# Patient Record
Sex: Male | Born: 1945 | Race: Black or African American | Hispanic: No | Marital: Married | Smoking: Never smoker
Health system: Southern US, Community
[De-identification: ages and names within clinical notes are randomized; demographics above are authoritative.]

## PROBLEM LIST (undated history)

## (undated) DIAGNOSIS — I10 Essential (primary) hypertension: Secondary | ICD-10-CM

## (undated) DIAGNOSIS — R569 Unspecified convulsions: Secondary | ICD-10-CM

## (undated) DIAGNOSIS — N189 Chronic kidney disease, unspecified: Secondary | ICD-10-CM

## (undated) HISTORY — PX: COLONOSCOPY: SHX174

## (undated) HISTORY — PX: ESOPHAGOGASTRODUODENOSCOPY: SHX1529

---

## 2005-06-02 ENCOUNTER — Emergency Department: Payer: Self-pay | Admitting: Emergency Medicine

## 2005-07-24 ENCOUNTER — Other Ambulatory Visit: Payer: Self-pay

## 2005-07-24 ENCOUNTER — Emergency Department: Payer: Self-pay | Admitting: Unknown Physician Specialty

## 2005-07-29 ENCOUNTER — Ambulatory Visit: Payer: Self-pay | Admitting: Family Medicine

## 2005-08-03 ENCOUNTER — Ambulatory Visit: Payer: Self-pay | Admitting: Family Medicine

## 2007-01-23 ENCOUNTER — Other Ambulatory Visit: Payer: Self-pay

## 2007-01-23 ENCOUNTER — Inpatient Hospital Stay: Payer: Self-pay | Admitting: Internal Medicine

## 2007-03-15 ENCOUNTER — Other Ambulatory Visit: Payer: Self-pay

## 2007-03-15 ENCOUNTER — Emergency Department: Payer: Self-pay | Admitting: Emergency Medicine

## 2007-12-14 ENCOUNTER — Other Ambulatory Visit: Payer: Self-pay

## 2007-12-14 ENCOUNTER — Emergency Department: Payer: Self-pay | Admitting: Internal Medicine

## 2008-05-25 ENCOUNTER — Emergency Department: Payer: Self-pay | Admitting: Emergency Medicine

## 2008-06-12 ENCOUNTER — Emergency Department: Payer: Self-pay | Admitting: Emergency Medicine

## 2012-03-17 ENCOUNTER — Ambulatory Visit: Payer: Self-pay | Admitting: Ophthalmology

## 2012-03-22 ENCOUNTER — Ambulatory Visit: Payer: Self-pay | Admitting: Ophthalmology

## 2013-02-23 ENCOUNTER — Emergency Department: Payer: Self-pay | Admitting: Emergency Medicine

## 2013-02-23 LAB — URINALYSIS, COMPLETE
Bacteria: NONE SEEN
Bilirubin,UR: NEGATIVE
Ketone: NEGATIVE
Leukocyte Esterase: NEGATIVE
Ph: 7 (ref 4.5–8.0)
Protein: NEGATIVE
Specific Gravity: 1.005 (ref 1.003–1.030)

## 2013-02-23 LAB — CBC
HCT: 47.1 % (ref 40.0–52.0)
HGB: 16 g/dL (ref 13.0–18.0)
MCH: 31.3 pg (ref 26.0–34.0)
MCHC: 33.9 g/dL (ref 32.0–36.0)
MCV: 92 fL (ref 80–100)
Platelet: 224 10*3/uL (ref 150–440)
RDW: 14.8 % — ABNORMAL HIGH (ref 11.5–14.5)

## 2013-02-23 LAB — COMPREHENSIVE METABOLIC PANEL
Albumin: 3.8 g/dL (ref 3.4–5.0)
Alkaline Phosphatase: 89 U/L (ref 50–136)
Bilirubin,Total: 0.7 mg/dL (ref 0.2–1.0)
Calcium, Total: 9.1 mg/dL (ref 8.5–10.1)
Co2: 32 mmol/L (ref 21–32)
EGFR (Non-African Amer.): >60
Glucose: 95 mg/dL (ref 65–99)
Potassium: 4 mmol/L (ref 3.5–5.1)
SGOT(AST): 50 U/L — ABNORMAL HIGH (ref 15–37)
SGPT (ALT): 33 U/L (ref 12–78)
Total Protein: 7.1 g/dL (ref 6.4–8.2)

## 2013-02-23 LAB — LIPASE, BLOOD: Lipase: 134 U/L (ref 73–393)

## 2013-04-06 DIAGNOSIS — B182 Chronic viral hepatitis C: Secondary | ICD-10-CM

## 2013-04-06 HISTORY — DX: Chronic viral hepatitis C: B18.2

## 2013-05-04 ENCOUNTER — Ambulatory Visit: Payer: Self-pay | Admitting: Gastroenterology

## 2013-05-06 LAB — PATHOLOGY REPORT

## 2013-08-10 ENCOUNTER — Ambulatory Visit: Payer: Self-pay | Admitting: Gastroenterology

## 2014-02-08 ENCOUNTER — Ambulatory Visit: Payer: Self-pay | Admitting: Gastroenterology

## 2014-06-11 ENCOUNTER — Ambulatory Visit: Payer: Self-pay | Admitting: Gastroenterology

## 2014-07-24 NOTE — Op Note (Signed)
PATIENT NAME:  Ian Gibson, Ian Gibson MR#:  [id number] DATE OF BIRTH:  [birth year]  DATE OF PROCEDURE:  03/22/2012  PREOPERATIVE DIAGNOSIS: Visually significant cataract of the left eye with a retrobulbar block.  POSTOPERATIVE DIAGNOSIS: Visually significant cataract of the left eye with a retrobulbar block.  OPERATIVE PROCEDURE: Cataract extraction by phacoemulsification with implant of intraocular lens to the left eye.   SURGEON: Birder Robson, MD  ANESTHESIA:  1. Managed anesthesia care.  2. 50-50 mixture of 0.75% bupivacaine and 4% Xylocaine given as a retrobulbar block.   COMPLICATIONS: None.   TECHNIQUE:  Stop and chop.  DESCRIPTION OF PROCEDURE: The patient was examined and consented for this procedure in the preoperative holding area and then brought back to the Operating Room where the anesthesia team employed managed anesthesia care. Then 3.5 milliliters of the aforementioned mixture were placed in the left orbit on an Atkinson needle without complication. The left eye was then prepped and draped in the usual sterile ophthalmic fashion. A lid speculum was placed. The side-port blade was used to create a paracentesis and the anterior chamber was filled with viscoelastic. The keratome was used to create a near clear corneal incision. The continuous curvilinear capsulorrhexis was performed with a cystotome followed by the capsulorrhexis forceps. Hydrodissection and hydrodelineation were carried out with BSS on a blunt cannula. The lens was removed in a chop and block technique. The remaining cortical material was removed with the irrigation-aspiration handpiece. The capsular bag was inflated with viscoelastic and the Technos ZCB00 16.5-diopter lens, serial number #[id number] was placed in the capsular bag without complication. The remaining viscoelastic was removed from the eye with the irrigation-aspiration handpiece. The wounds were hydrated. The anterior chamber was flushed with Miostat. The  eye was inflated to a physiologic pressure and the wounds were found to be water tight. The eye was dressed with Vigamox followed by Maxitrol ointment and a protective shield was placed. The patient will followup with me in one day.   Also please note at the onset of the case 3 to 4 hours of clock hours of zonulysis dehiscence were noted as this is a traumatic cataract and a capsular tension ring was placed to redistribute the _____ tension and provide better centration of the implant.     ____________________________ Livingston Diones. Prisha Hiley, MD wlp:ct D: 03/22/2012 21:58:25 ET T: 03/23/2012 10:33:09 ET JOB#: [id number]  cc: Rondel Episcopo L. Nahum Sherrer, MD, <Dictator>  Livingston Diones Deroy Noah MD ELECTRONICALLY SIGNED 03/25/2012 10:52

## 2014-07-30 LAB — SURGICAL PATHOLOGY

## 2015-02-13 ENCOUNTER — Other Ambulatory Visit: Payer: Self-pay | Admitting: Gastroenterology

## 2015-02-13 DIAGNOSIS — R109 Unspecified abdominal pain: Secondary | ICD-10-CM

## 2015-02-13 DIAGNOSIS — K7460 Unspecified cirrhosis of liver: Secondary | ICD-10-CM

## 2015-02-18 ENCOUNTER — Other Ambulatory Visit: Payer: Self-pay | Admitting: Internal Medicine

## 2015-02-20 ENCOUNTER — Ambulatory Visit
Admission: RE | Admit: 2015-02-20 | Discharge: 2015-02-20 | Disposition: A | Discharge status: Home / Self Care | Payer: Medicare Other | Source: Ambulatory Visit | Attending: Gastroenterology | Admitting: Gastroenterology

## 2015-02-20 DIAGNOSIS — K7460 Unspecified cirrhosis of liver: Secondary | ICD-10-CM | POA: Insufficient documentation

## 2015-02-20 DIAGNOSIS — R109 Unspecified abdominal pain: Secondary | ICD-10-CM | POA: Insufficient documentation

## 2015-02-20 DIAGNOSIS — I2510 Atherosclerotic heart disease of native coronary artery without angina pectoris: Secondary | ICD-10-CM | POA: Insufficient documentation

## 2015-02-20 HISTORY — DX: Essential (primary) hypertension: I10

## 2015-02-20 MED ORDER — IOHEXOL 300 MG/ML  SOLN
100.0000 mL | Freq: Once | INTRAMUSCULAR | Status: AC | PRN
  Administered 2015-02-20: 100 mL via INTRAVENOUS

## 2016-03-02 ENCOUNTER — Other Ambulatory Visit: Payer: Self-pay | Admitting: Student

## 2016-03-02 DIAGNOSIS — K7460 Unspecified cirrhosis of liver: Principal | ICD-10-CM

## 2016-03-02 DIAGNOSIS — B182 Chronic viral hepatitis C: Secondary | ICD-10-CM

## 2016-03-11 ENCOUNTER — Ambulatory Visit
Admission: RE | Admit: 2016-03-11 | Discharge: 2016-03-11 | Disposition: A | Discharge status: Home / Self Care | Payer: Medicare Other | Source: Ambulatory Visit | Attending: Student | Admitting: Student

## 2016-03-11 DIAGNOSIS — B182 Chronic viral hepatitis C: Secondary | ICD-10-CM | POA: Diagnosis present

## 2016-03-11 DIAGNOSIS — K7460 Unspecified cirrhosis of liver: Secondary | ICD-10-CM | POA: Diagnosis not present

## 2016-03-11 DIAGNOSIS — K8020 Calculus of gallbladder without cholecystitis without obstruction: Secondary | ICD-10-CM | POA: Diagnosis not present

## 2017-05-18 DIAGNOSIS — Z8719 Personal history of other diseases of the digestive system: Secondary | ICD-10-CM | POA: Insufficient documentation

## 2017-05-18 DIAGNOSIS — K766 Portal hypertension: Secondary | ICD-10-CM

## 2017-05-18 DIAGNOSIS — I8500 Esophageal varices without bleeding: Secondary | ICD-10-CM | POA: Insufficient documentation

## 2017-05-20 ENCOUNTER — Other Ambulatory Visit: Payer: Self-pay | Admitting: Student

## 2017-05-20 DIAGNOSIS — K7460 Unspecified cirrhosis of liver: Principal | ICD-10-CM

## 2017-05-20 DIAGNOSIS — B182 Chronic viral hepatitis C: Secondary | ICD-10-CM

## 2017-05-25 ENCOUNTER — Ambulatory Visit
Admission: RE | Admit: 2017-05-25 | Discharge: 2017-05-25 | Disposition: A | Discharge status: Home / Self Care | Payer: Medicare Other | Source: Ambulatory Visit | Attending: Student | Admitting: Student

## 2017-05-25 DIAGNOSIS — K7689 Other specified diseases of liver: Secondary | ICD-10-CM | POA: Insufficient documentation

## 2017-05-25 DIAGNOSIS — K7460 Unspecified cirrhosis of liver: Secondary | ICD-10-CM | POA: Insufficient documentation

## 2017-05-25 DIAGNOSIS — K8020 Calculus of gallbladder without cholecystitis without obstruction: Secondary | ICD-10-CM | POA: Diagnosis not present

## 2017-05-25 DIAGNOSIS — B182 Chronic viral hepatitis C: Secondary | ICD-10-CM | POA: Insufficient documentation

## 2017-07-20 ENCOUNTER — Encounter (INDEPENDENT_AMBULATORY_CARE_PROVIDER_SITE_OTHER): Payer: Self-pay

## 2017-07-20 ENCOUNTER — Ambulatory Visit (INDEPENDENT_AMBULATORY_CARE_PROVIDER_SITE_OTHER): Payer: Medicare Other | Admitting: Gastroenterology

## 2017-07-20 ENCOUNTER — Encounter: Payer: Self-pay | Admitting: Gastroenterology

## 2017-07-20 ENCOUNTER — Other Ambulatory Visit: Payer: Self-pay

## 2017-07-20 VITALS — BP 143/76 | HR 60 | Temp 98.4°F | Ht 73.0 in | Wt 226.0 lb

## 2017-07-20 DIAGNOSIS — E118 Type 2 diabetes mellitus with unspecified complications: Secondary | ICD-10-CM | POA: Insufficient documentation

## 2017-07-20 DIAGNOSIS — E1142 Type 2 diabetes mellitus with diabetic polyneuropathy: Secondary | ICD-10-CM | POA: Insufficient documentation

## 2017-07-20 DIAGNOSIS — R1084 Generalized abdominal pain: Secondary | ICD-10-CM | POA: Diagnosis not present

## 2017-07-20 DIAGNOSIS — E119 Type 2 diabetes mellitus without complications: Secondary | ICD-10-CM | POA: Insufficient documentation

## 2017-07-20 DIAGNOSIS — K7460 Unspecified cirrhosis of liver: Secondary | ICD-10-CM

## 2017-07-20 DIAGNOSIS — B182 Chronic viral hepatitis C: Secondary | ICD-10-CM | POA: Insufficient documentation

## 2017-07-20 DIAGNOSIS — E785 Hyperlipidemia, unspecified: Secondary | ICD-10-CM | POA: Insufficient documentation

## 2017-07-20 DIAGNOSIS — Z8619 Personal history of other infectious and parasitic diseases: Secondary | ICD-10-CM | POA: Insufficient documentation

## 2017-07-20 DIAGNOSIS — I10 Essential (primary) hypertension: Secondary | ICD-10-CM | POA: Insufficient documentation

## 2017-07-20 DIAGNOSIS — G40409 Other generalized epilepsy and epileptic syndromes, not intractable, without status epilepticus: Secondary | ICD-10-CM | POA: Insufficient documentation

## 2017-07-20 MED ORDER — CYCLOBENZAPRINE HCL 5 MG PO TABS: 5.0000 mg | ORAL_TABLET | Freq: Three times a day (TID) | ORAL | 1 refills | Status: DC | PRN
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
## 2017-12-31 ENCOUNTER — Other Ambulatory Visit: Payer: Self-pay | Admitting: Student

## 2017-12-31 DIAGNOSIS — B182 Chronic viral hepatitis C: Secondary | ICD-10-CM

## 2018-02-15 ENCOUNTER — Ambulatory Visit
Admission: RE | Admit: 2018-02-15 | Discharge: 2018-02-15 | Disposition: A | Discharge status: Home / Self Care | Payer: Medicare Other | Source: Ambulatory Visit | Attending: Student | Admitting: Student

## 2018-02-15 DIAGNOSIS — K8020 Calculus of gallbladder without cholecystitis without obstruction: Secondary | ICD-10-CM | POA: Diagnosis not present

## 2018-02-15 DIAGNOSIS — R911 Solitary pulmonary nodule: Secondary | ICD-10-CM | POA: Insufficient documentation

## 2018-02-15 DIAGNOSIS — M5136 Other intervertebral disc degeneration, lumbar region: Secondary | ICD-10-CM | POA: Diagnosis not present

## 2018-02-15 DIAGNOSIS — B182 Chronic viral hepatitis C: Secondary | ICD-10-CM | POA: Diagnosis not present

## 2018-02-15 DIAGNOSIS — I700 Atherosclerosis of aorta: Secondary | ICD-10-CM | POA: Insufficient documentation

## 2018-02-15 DIAGNOSIS — K7460 Unspecified cirrhosis of liver: Secondary | ICD-10-CM | POA: Diagnosis not present

## 2018-02-15 LAB — POCT I-STAT CREATININE: CREATININE: 1.6 mg/dL — AB (ref 0.61–1.24)

## 2018-02-15 MED ORDER — GADOBUTROL 1 MMOL/ML IV SOLN
10.0000 mL | Freq: Once | INTRAVENOUS | Status: AC | PRN
  Administered 2018-02-15: 10 mL via INTRAVENOUS

## 2018-12-30 DIAGNOSIS — I129 Hypertensive chronic kidney disease with stage 1 through stage 4 chronic kidney disease, or unspecified chronic kidney disease: Secondary | ICD-10-CM | POA: Insufficient documentation

## 2018-12-30 DIAGNOSIS — N1831 Chronic kidney disease, stage 3a: Secondary | ICD-10-CM | POA: Insufficient documentation

## 2019-01-04 ENCOUNTER — Other Ambulatory Visit: Payer: Self-pay | Admitting: Nephrology

## 2019-01-04 DIAGNOSIS — N183 Chronic kidney disease, stage 3 unspecified: Secondary | ICD-10-CM

## 2019-01-13 ENCOUNTER — Other Ambulatory Visit: Payer: Self-pay | Admitting: Nephrology

## 2019-01-13 ENCOUNTER — Ambulatory Visit
Admission: RE | Admit: 2019-01-13 | Discharge: 2019-01-13 | Disposition: A | Discharge status: Home / Self Care | Payer: Medicare Other | Source: Ambulatory Visit | Attending: Nephrology | Admitting: Nephrology

## 2019-01-13 ENCOUNTER — Other Ambulatory Visit: Payer: Self-pay

## 2019-01-13 DIAGNOSIS — N183 Chronic kidney disease, stage 3 unspecified: Secondary | ICD-10-CM

## 2019-01-13 DIAGNOSIS — I129 Hypertensive chronic kidney disease with stage 1 through stage 4 chronic kidney disease, or unspecified chronic kidney disease: Secondary | ICD-10-CM

## 2020-03-06 ENCOUNTER — Encounter: Payer: Self-pay | Admitting: Internal Medicine

## 2020-04-30 ENCOUNTER — Ambulatory Visit: Payer: Medicare Other | Admitting: Gastroenterology

## 2020-05-14 ENCOUNTER — Other Ambulatory Visit: Payer: Self-pay

## 2020-05-14 ENCOUNTER — Encounter: Payer: Self-pay | Admitting: Gastroenterology

## 2020-05-14 ENCOUNTER — Ambulatory Visit (INDEPENDENT_AMBULATORY_CARE_PROVIDER_SITE_OTHER): Payer: Medicare Other | Admitting: Gastroenterology

## 2020-05-14 VITALS — BP 142/70 | HR 59 | Ht 73.0 in | Wt 246.4 lb

## 2020-05-14 DIAGNOSIS — K7460 Unspecified cirrhosis of liver: Secondary | ICD-10-CM

## 2020-05-14 DIAGNOSIS — B182 Chronic viral hepatitis C: Secondary | ICD-10-CM

## 2020-05-14 MED ORDER — NADOLOL 40 MG PO TABS: 40.0000 mg | ORAL_TABLET | Freq: Every day | ORAL | 11 refills | Status: DC
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
## 2020-05-15 ENCOUNTER — Telehealth: Payer: Self-pay | Admitting: Gastroenterology

## 2020-05-15 NOTE — Telephone Encounter (Signed)
Contacted pt's wife regarding her question about the prescription for Nadolol. They have requested a 90 day supply be sent vs a 30 day supply. Pharmacy has been contacted and switched to 90 days.

## 2020-05-15 NOTE — Telephone Encounter (Signed)
Patient's wife called with questions regarding her husband, please call to advise

## 2020-05-20 ENCOUNTER — Ambulatory Visit
Admission: RE | Admit: 2020-05-20 | Discharge: 2020-05-20 | Disposition: A | Discharge status: Home / Self Care | Payer: Medicare Other | Source: Ambulatory Visit | Attending: Gastroenterology | Admitting: Gastroenterology

## 2020-05-20 ENCOUNTER — Other Ambulatory Visit: Payer: Self-pay

## 2020-05-20 DIAGNOSIS — B182 Chronic viral hepatitis C: Secondary | ICD-10-CM | POA: Insufficient documentation

## 2020-05-20 DIAGNOSIS — K7460 Unspecified cirrhosis of liver: Secondary | ICD-10-CM | POA: Insufficient documentation

## 2020-05-21 ENCOUNTER — Telehealth: Payer: Self-pay

## 2020-05-21 NOTE — Telephone Encounter (Signed)
-----   Message from Lucilla Lame, MD sent at 05/20/2020  9:32 AM EST ----- Let the patient know that the ultrasound showed cirrhosis as expected without any sign of a mass.  There were some gallstones which do not appear to be causing any problems.  The patient needs a repeat ultrasound in 6 months.

## 2020-05-21 NOTE — Telephone Encounter (Signed)
Pt's wife notified of US results.  

## 2021-05-26 IMAGING — US US RENAL
1 series · 14 of 25 positions shown · non-contrast
Comparison: None.

CLINICAL DATA: Chronic kidney disease stage 3

EXAM:
RENAL / URINARY TRACT ULTRASOUND COMPLETE

[Series 1: us renal · 0.23mm/px · 14 of 29 slices shown]
[im 1/29]
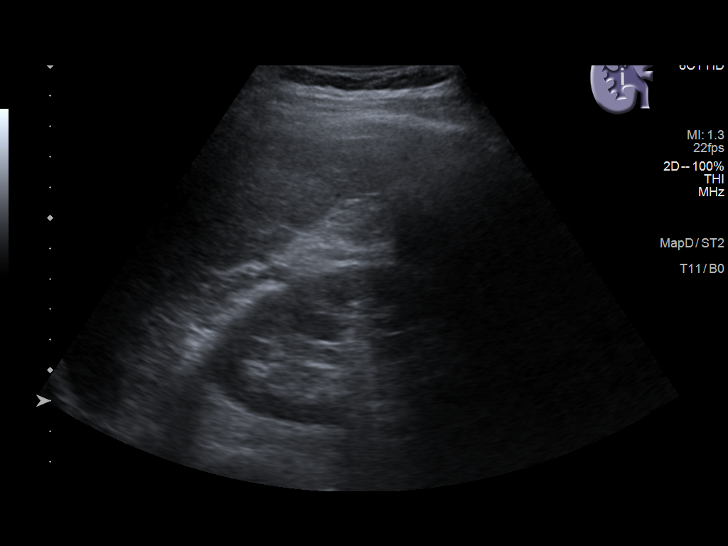
[im 3/29]
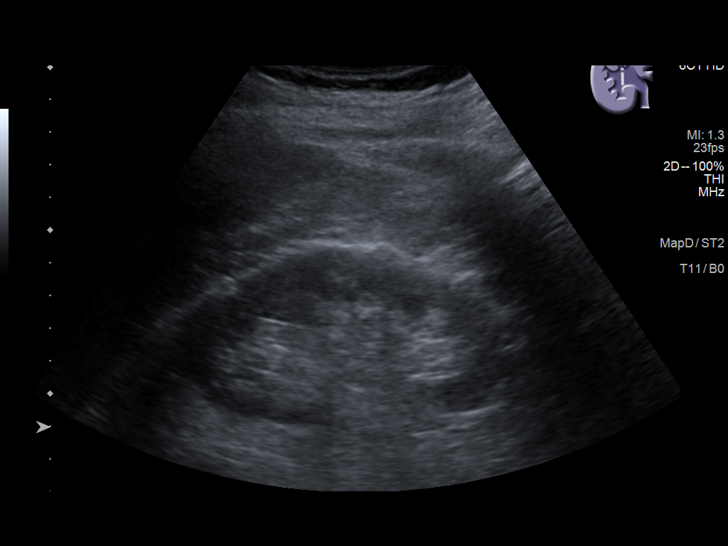
[im 5/29]
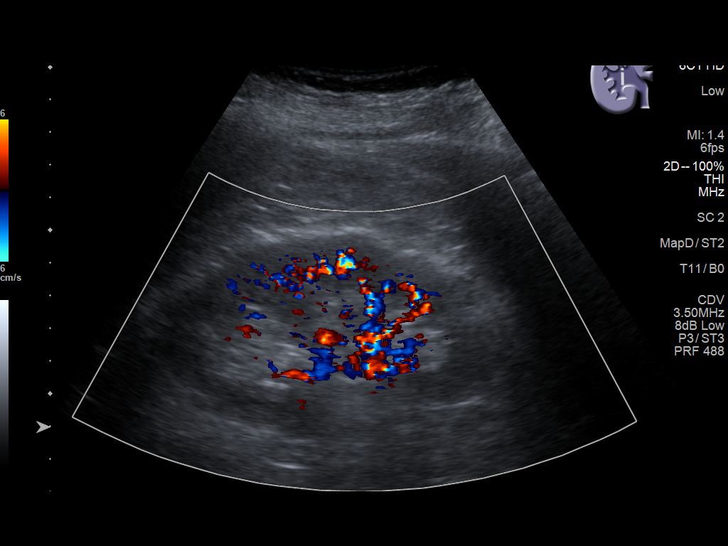
[im 8/29]
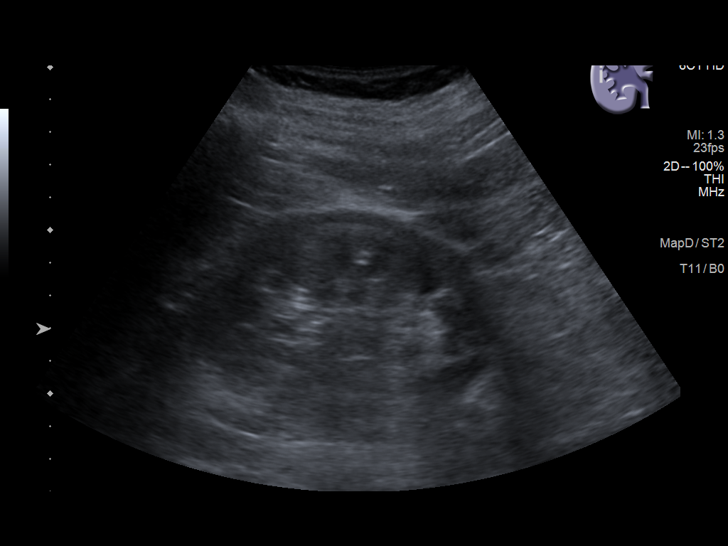
[im 10/29]
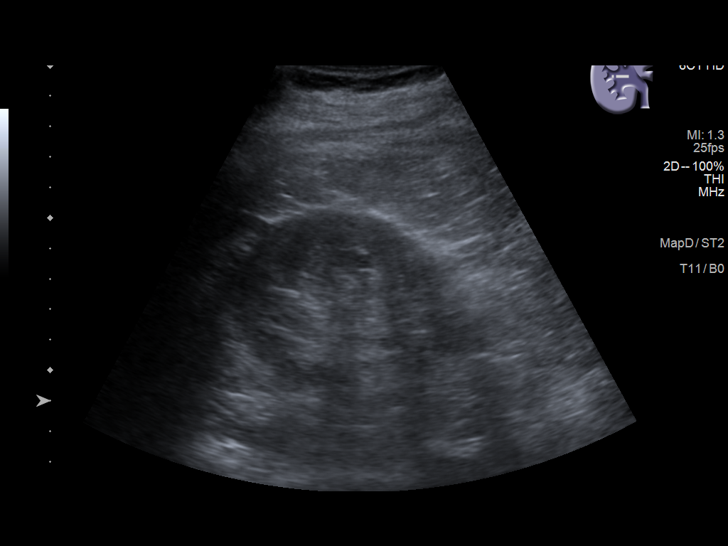
[im 11/29]
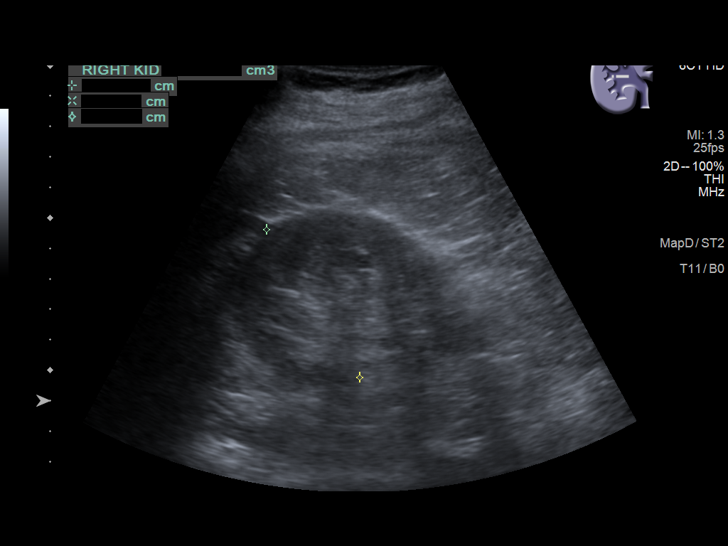
[im 13/29]
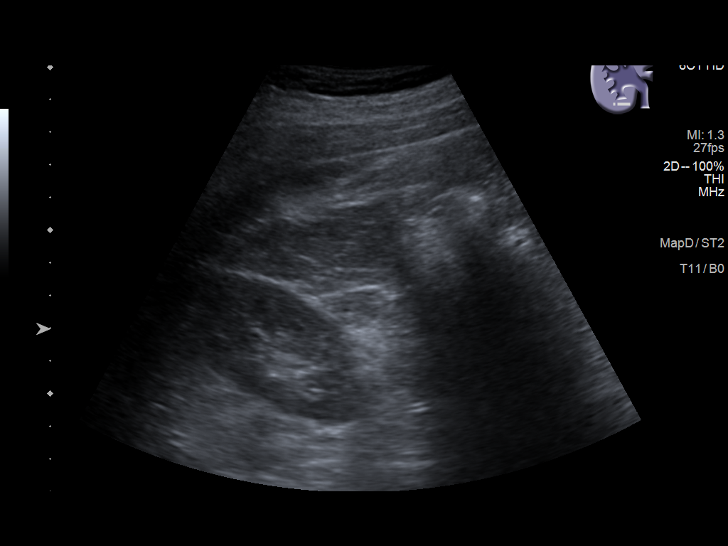
[im 16/29]
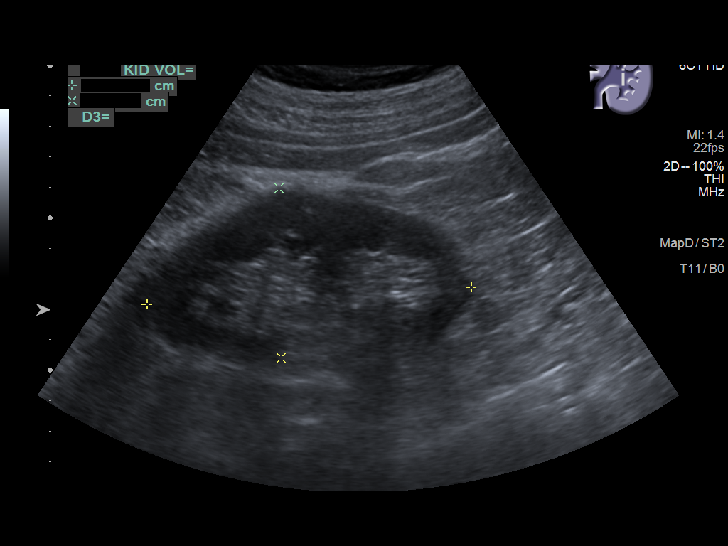
[im 18/29]
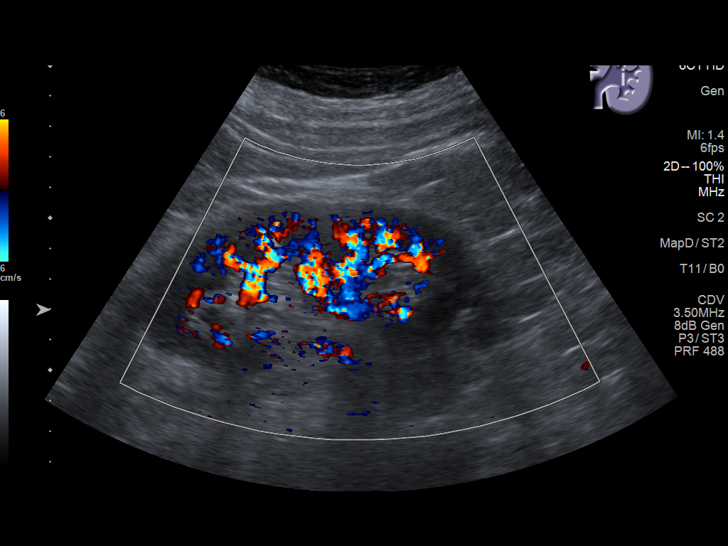
[im 19/29]
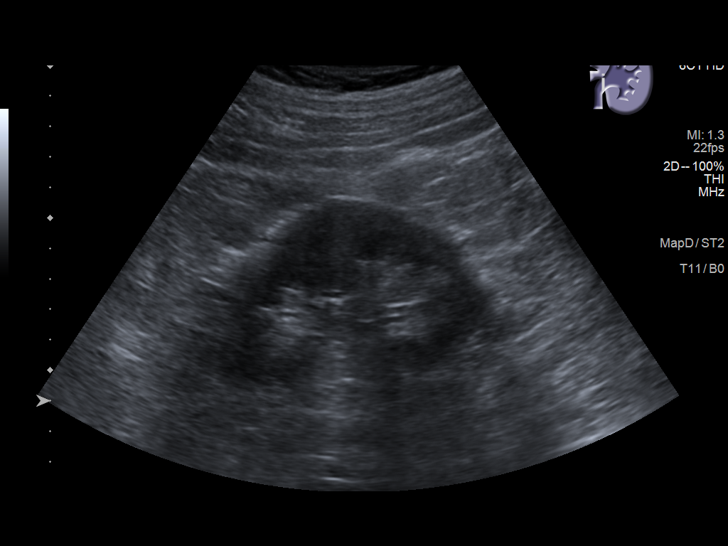
[im 22/29]
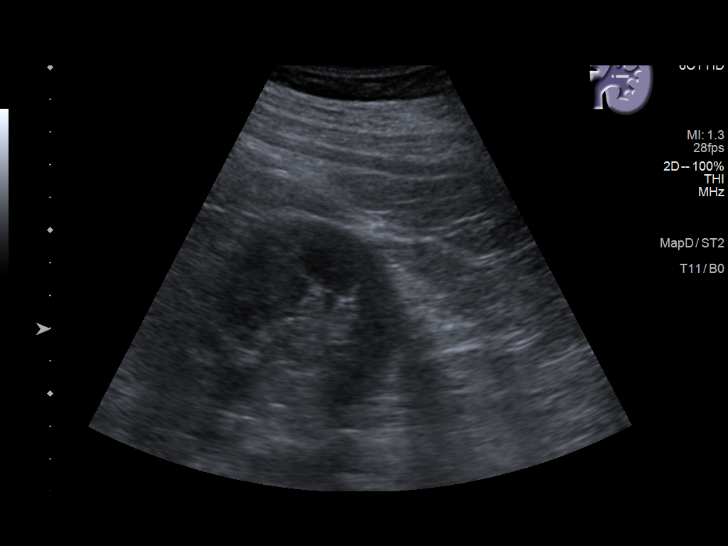
[im 24/29]
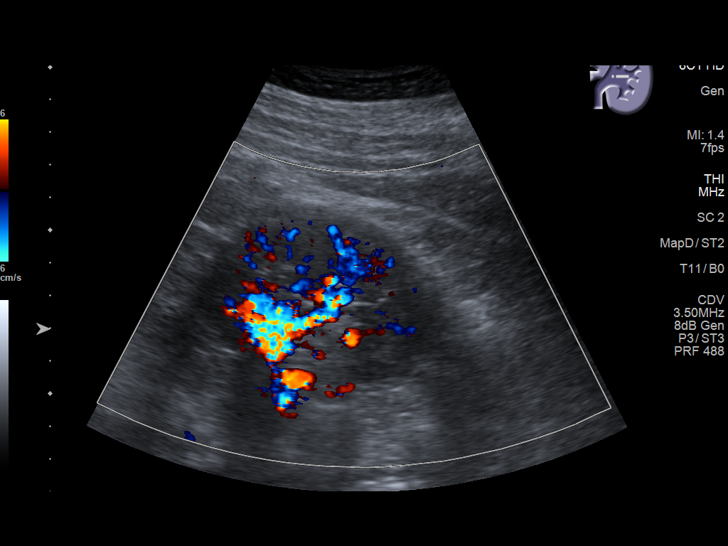
[im 26/29]
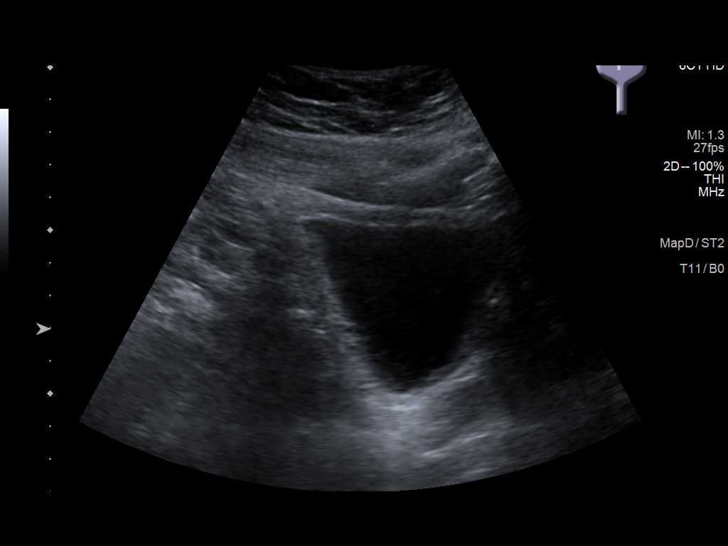
[im 29/29]
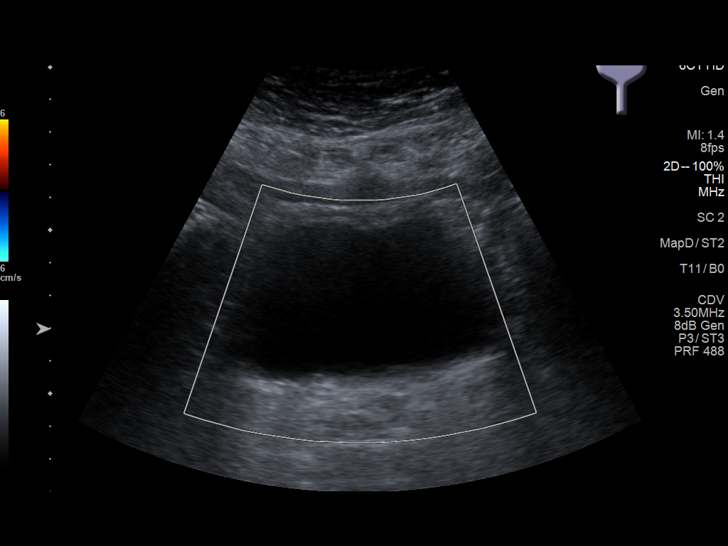

[14 of 25 positions shown; findings below may reference images not displayed]

FINDINGS: Right Kidney:

Renal measurements: 10.1 x 5.4 x 5.7 cm = volume: 163 mL .
Echogenicity within normal limits. No mass or hydronephrosis
visualized.

Left Kidney:

Renal measurements: 10.7 x 5.6 x 4.6 cm = volume: 141 mL.
Echogenicity within normal limits. No mass or hydronephrosis
visualized.

Bladder:

Appears normal for degree of bladder distention.
IMPRESSION: Normal kidney ultrasound.

## 2021-07-28 ENCOUNTER — Telehealth: Payer: Self-pay | Admitting: Gastroenterology

## 2021-07-28 NOTE — Telephone Encounter (Signed)
Patient's spouse called to make an appt for patient. States he is out of the medication nadolol 40 MG. Requesting a refill. States the pharmacy said to speak to the doctor.  ?

## 2021-07-29 MED ORDER — NADOLOL 40 MG PO TABS: 40.0000 mg | ORAL_TABLET | Freq: Every day | ORAL | 5 refills | Status: DC

## 2021-07-29 NOTE — Addendum Note (Signed)
Addended by: Lurlean Nanny on: 07/29/2021 04:10 PM ? ? Modules accepted: Orders ? ?

## 2021-09-08 ENCOUNTER — Encounter: Payer: Self-pay | Admitting: Ophthalmology

## 2021-09-15 NOTE — Discharge Instructions (Signed)

## 2021-09-16 ENCOUNTER — Other Ambulatory Visit: Payer: Self-pay

## 2021-09-16 ENCOUNTER — Ambulatory Visit: Payer: Medicare Other | Admitting: Anesthesiology

## 2021-09-16 ENCOUNTER — Encounter: Payer: Self-pay | Admitting: Ophthalmology

## 2021-09-16 ENCOUNTER — Ambulatory Visit
Admission: RE | Admit: 2021-09-16 | Discharge: 2021-09-16 | Disposition: A | Discharge status: Home / Self Care | Payer: Medicare Other | Attending: Ophthalmology | Admitting: Ophthalmology

## 2021-09-16 ENCOUNTER — Encounter
Admission: RE | Disposition: A | Discharge status: Home / Self Care | Payer: Self-pay | Source: Home / Self Care | Attending: Ophthalmology

## 2021-09-16 DIAGNOSIS — H2511 Age-related nuclear cataract, right eye: Secondary | ICD-10-CM | POA: Diagnosis present

## 2021-09-16 DIAGNOSIS — I129 Hypertensive chronic kidney disease with stage 1 through stage 4 chronic kidney disease, or unspecified chronic kidney disease: Secondary | ICD-10-CM | POA: Diagnosis not present

## 2021-09-16 DIAGNOSIS — N189 Chronic kidney disease, unspecified: Secondary | ICD-10-CM | POA: Diagnosis not present

## 2021-09-16 HISTORY — DX: Unspecified convulsions: R56.9

## 2021-09-16 HISTORY — PX: CATARACT EXTRACTION W/PHACO: SHX586

## 2021-09-16 HISTORY — DX: Chronic kidney disease, unspecified: N18.9

## 2021-09-16 SURGERY — PHACOEMULSIFICATION, CATARACT, WITH IOL INSERTION
Anesthesia: Monitor Anesthesia Care | Site: Eye | Laterality: Right

## 2021-09-16 MED ORDER — LACTATED RINGERS IV SOLN: INTRAVENOUS | Status: DC

## 2021-09-16 MED ORDER — TETRACAINE HCL 0.5 % OP SOLN
1.0000 [drp] | OPHTHALMIC | Status: DC | PRN
  Administered 2021-09-16 (×3): 1 [drp] via OPHTHALMIC

## 2021-09-16 MED ORDER — SIGHTPATH DOSE#1 BSS IO SOLN
INTRAOCULAR | Status: DC | PRN
  Administered 2021-09-16: 15 mL

## 2021-09-16 MED ORDER — SIGHTPATH DOSE#1 BSS IO SOLN: INTRAOCULAR | Status: DC | PRN

## 2021-09-16 MED ORDER — ACETAMINOPHEN 325 MG PO TABS: 325.0000 mg | ORAL_TABLET | Freq: Once | ORAL | Status: DC

## 2021-09-16 MED ORDER — SIGHTPATH DOSE#1 BSS IO SOLN
INTRAOCULAR | Status: DC | PRN
  Administered 2021-09-16: 62 mL via OPHTHALMIC

## 2021-09-16 MED ORDER — FENTANYL CITRATE (PF) 100 MCG/2ML IJ SOLN
INTRAMUSCULAR | Status: DC | PRN
Start: 2021-09-16 — End: 2021-09-16
  Administered 2021-09-16: 50 ug via INTRAVENOUS

## 2021-09-16 MED ORDER — MIDAZOLAM HCL 2 MG/2ML IJ SOLN
INTRAMUSCULAR | Status: DC | PRN
  Administered 2021-09-16: 1 mg via INTRAVENOUS

## 2021-09-16 MED ORDER — ACETAMINOPHEN 160 MG/5ML PO SOLN: 325.0000 mg | Freq: Once | ORAL | Status: DC

## 2021-09-16 MED ORDER — ARMC OPHTHALMIC DILATING DROPS
1.0000 "application " | OPHTHALMIC | Status: DC | PRN
  Administered 2021-09-16 (×3): 1 via OPHTHALMIC

## 2021-09-16 MED ORDER — SIGHTPATH DOSE#1 NA CHONDROIT SULF-NA HYALURON 40-17 MG/ML IO SOLN
INTRAOCULAR | Status: DC | PRN
  Administered 2021-09-16: 1 mL via INTRAOCULAR

## 2021-09-16 SURGICAL SUPPLY — 10 items
CATARACT SUITE SIGHTPATH (MISCELLANEOUS) ×2 IMPLANT
FEE CATARACT SUITE SIGHTPATH (MISCELLANEOUS) ×1 IMPLANT
GLOVE SURG ENC TEXT LTX SZ8 (GLOVE) ×2 IMPLANT
GLOVE SURG TRIUMPH 8.0 PF LTX (GLOVE) ×2 IMPLANT
LENS IOL TECNIS EYHANCE 16.0 (Intraocular Lens) ×1 IMPLANT
NDL FILTER BLUNT 18X1 1/2 (NEEDLE) ×1 IMPLANT
NEEDLE FILTER BLUNT 18X 1/2SAF (NEEDLE) ×1
NEEDLE FILTER BLUNT 18X1 1/2 (NEEDLE) ×1 IMPLANT
SYR 3ML LL SCALE MARK (SYRINGE) ×2 IMPLANT
WATER STERILE IRR 250ML POUR (IV SOLUTION) ×2 IMPLANT

## 2021-09-16 NOTE — H&P (Signed)
[facility]   Primary Care Physician:  Jodi Marble, MD Ophthalmologist: Dr. George Ina  Pre-Procedure History & Physical: HPI:  Ian Gibson is a 76 y.o. male here for cataract surgery.   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  "cured"   Hypertension    Seizure ([place])    on Keppra.  No seizures in "many" years.    Past Surgical History:  Procedure Laterality Date   COLONOSCOPY     ESOPHAGOGASTRODUODENOSCOPY      Prior to Admission medications   Medication Sig Start Date End Date Taking? Authorizing Provider  aspirin EC 81 MG tablet Take by mouth.   Yes [provider]  atorvastatin (LIPITOR) 40 MG tablet SMARTSIG:1 Tablet(s) By Mouth Every Evening 04/19/20  Yes [provider]  cetirizine (ZYRTEC) 10 MG tablet TK 1 T PO QAM 12/04/14  Yes [provider]  chlorthalidone (HYGROTON) 25 MG tablet TK 1/2 TO 1 T PO D UTD 04/26/17  Yes [provider]  clobetasol cream (TEMOVATE) 0.05 % APP THIN FILM AA ON BACK BID UNTIL CLEAR 07/14/17  Yes [provider]  fluticasone (FLONASE) 50 MCG/ACT nasal spray Place into both nostrils daily.   Yes [provider]  gabapentin (NEURONTIN) 800 MG tablet Take 800 mg by mouth 3 (three) times daily. 04/08/20  Yes [provider]  levETIRAcetam (KEPPRA) 500 MG tablet Take 500 mg by mouth 2 (two) times daily. 04/08/20  Yes [provider]  losartan (COZAAR) 25 MG tablet Take 25 mg by mouth daily. 05/10/20  Yes [provider]  nadolol (CORGARD) 40 MG tablet Take 1 tablet (40 mg total) by mouth daily. 07/29/21  Yes Lucilla Lame, MD  zolpidem (AMBIEN) 10 MG tablet Take 10 mg by mouth at bedtime as needed. 05/13/20  Yes [provider]    Allergies as of 08/18/2021   (No Known Allergies)    History reviewed. No pertinent family  history.  Social History   Socioeconomic History   Marital status: Married    Spouse name: Not on file   Number of children: Not on file   Years of education: Not on file   Highest education level: Not on file  Occupational History   Not on file  Tobacco Use   Smoking status: Never   Smokeless tobacco: Never  Vaping Use   Vaping Use: Never used  Substance and Sexual Activity   Alcohol use: Never   Drug use: Never   Sexual activity: Not on file  Other Topics Concern   Not on file  Social History Narrative   Not on file   Social Determinants of Health   Financial Resource Strain: Not on file  Food Insecurity: Not on file  Transportation Needs: Not on file  Physical Activity: Not on file  Stress: Not on file  Social Connections: Not on file  Intimate Partner Violence: Not on file    Review of Systems: See HPI, otherwise negative ROS  Physical Exam: BP (!) 156/79   Pulse (!) 49   Temp 97.7 F (36.5 C) (Temporal)   Ht 6' (1.829 m)   Wt 111 kg   SpO2 97%   BMI 33.20 kg/m  General:   Alert, cooperative in NAD Head:  Normocephalic and atraumatic. Respiratory:  Normal work of breathing. Cardiovascular:  RRR  Impression/Plan: Ian Manns  Gibson is here for cataract surgery.  Risks, benefits, limitations, and alternatives regarding cataract surgery have been reviewed with the patient.  Questions have been answered.  All parties agreeable.   Ian Robson, MD  09/16/2021, 1:11 PM

## 2021-09-16 NOTE — Anesthesia Procedure Notes (Signed)
Procedure Name: MAC Date/Time: 09/16/2021 1:22 PM  Performed by: Dionne Bucy, CRNAPre-anesthesia Checklist: Patient identified, Emergency Drugs available, Suction available, Patient being monitored and Timeout performed Patient Re-evaluated:Patient Re-evaluated prior to induction Oxygen Delivery Method: Nasal cannula Placement Confirmation: positive ETCO2

## 2021-09-16 NOTE — Anesthesia Postprocedure Evaluation (Signed)
Anesthesia Post Note  Patient: Ian Gibson  Procedure(s) Performed:  7.44 00:54.4 (Right: Eye)     Patient location during evaluation: PACU Anesthesia Type: MAC Level of consciousness: awake and alert and oriented Pain management: satisfactory to patient Vital Signs Assessment: post-procedure vital signs reviewed and stable Respiratory status: spontaneous breathing, nonlabored ventilation and respiratory function stable Cardiovascular status: blood pressure returned to baseline and stable Postop Assessment: Adequate PO intake and No signs of nausea or vomiting Anesthetic complications: no   No notable events documented.  Raliegh Ip

## 2021-09-16 NOTE — Transfer of Care (Signed)
Immediate Anesthesia Transfer of Care Note  Patient: Ian Gibson  Procedure(s) Performed:  7.44 00:54.4 (Right: Eye)  Patient Location: PACU  Anesthesia Type: MAC  Level of Consciousness: awake, alert  and patient cooperative  Airway and Oxygen Therapy: Patient Spontanous Breathing and Patient connected to supplemental oxygen  Post-op Assessment: Post-op Vital signs reviewed, Patient's Cardiovascular Status Stable, Respiratory Function Stable, Patent Airway and No signs of Nausea or vomiting  Post-op Vital Signs: Reviewed and stable  Complications: No notable events documented.

## 2021-09-16 NOTE — Anesthesia Preprocedure Evaluation (Signed)
Anesthesia Evaluation  Patient identified by MRN, date of birth, ID band Patient awake    Reviewed: Allergy & Precautions, H&P , NPO status , Patient's Chart, lab work & pertinent test results  Airway Mallampati: II  TM Distance: >3 FB Neck ROM: limited    Dental  (+) Edentulous Upper, Edentulous Lower   Pulmonary    Pulmonary exam normal breath sounds clear to auscultation       Cardiovascular hypertension, Normal cardiovascular exam Rhythm:regular Rate:Normal     Neuro/Psych Seizures -,     GI/Hepatic   Endo/Other    Renal/GU      Musculoskeletal   Abdominal   Peds  Hematology   Anesthesia Other Findings   Reproductive/Obstetrics                             Anesthesia Physical Anesthesia Plan  ASA: 2  Anesthesia Plan: MAC   Post-op Pain Management: Minimal or no pain anticipated   Induction:   PONV Risk Score and Plan: 1 and Treatment may vary due to age or medical condition, TIVA and Midazolam  Airway Management Planned:   Additional Equipment:   Intra-op Plan:   Post-operative Plan:   Informed Consent: I have reviewed the patients History and Physical, chart, labs and discussed the procedure including the risks, benefits and alternatives for the proposed anesthesia with the patient or authorized representative who has indicated his/her understanding and acceptance.     Dental Advisory Given  Plan Discussed with: CRNA  Anesthesia Plan Comments:         Anesthesia Quick Evaluation

## 2021-09-16 NOTE — Op Note (Signed)
PREOPERATIVE DIAGNOSIS:  Nuclear sclerotic cataract of the right eye.   POSTOPERATIVE DIAGNOSIS:  H25.11- CATARACT   OPERATIVE PROCEDURE:ORPROCALL@   SURGEON:  Birder Robson, MD.   ANESTHESIA:  Anesthesiologist: Ronelle Nigh, MD CRNA: Dionne Bucy, CRNA  1.      Managed anesthesia care. 2.      0.14m of Shugarcaine was instilled in the eye following the paracentesis.   COMPLICATIONS:  None.   TECHNIQUE:   Stop and chop   DESCRIPTION OF PROCEDURE:  The patient was examined and consented in the preoperative holding area where the aforementioned topical anesthesia was applied to the right eye and then brought back to the Operating Room where the right eye was prepped and draped in the usual sterile ophthalmic fashion and a lid speculum was placed. A paracentesis was created with the side port blade and the anterior chamber was filled with viscoelastic. A near clear corneal incision was performed with the steel keratome. A continuous curvilinear capsulorrhexis was performed with a cystotome followed by the capsulorrhexis forceps. Hydrodissection and hydrodelineation were carried out with BSS on a blunt cannula. The lens was removed in a stop and chop  technique and the remaining cortical material was removed with the irrigation-aspiration handpiece. The capsular bag was inflated with viscoelastic and the Technis ZCB00  lens was placed in the capsular bag without complication. The remaining viscoelastic was removed from the eye with the irrigation-aspiration handpiece. The wounds were hydrated. The anterior chamber was flushed with BSS and the eye was inflated to physiologic pressure. 0.166mof Vigamox was placed in the anterior chamber. The wounds were found to be water tight. The eye was dressed with Combigan. The patient was given protective glasses to wear throughout the day and a shield with which to sleep tonight. The patient was also given drops with which to begin a drop regimen today  and will follow-up with me in one day. Implant Name Type Inv. Item Serial No. Manufacturer Lot No. LRB No. Used Action  LENS IOL TECNIS EYHANCE 16.0 - [id number] Lens LENS IOL TECNIS EYHANCE 16.0 [id number]  Right 1 Implanted   Procedure(s):  7.44 00:54.4 (Right)  Electronically signed: WiBirder Robson/13/2023 1:38 PM

## 2021-09-17 ENCOUNTER — Encounter: Payer: Self-pay | Admitting: Ophthalmology

## 2021-12-02 ENCOUNTER — Ambulatory Visit: Payer: Medicare Other | Admitting: Gastroenterology

## 2021-12-22 ENCOUNTER — Ambulatory Visit (INDEPENDENT_AMBULATORY_CARE_PROVIDER_SITE_OTHER): Payer: Medicare Other | Admitting: Urology

## 2021-12-22 ENCOUNTER — Encounter: Payer: Self-pay | Admitting: Urology

## 2021-12-22 VITALS — BP 175/92 | HR 66 | Ht 73.0 in | Wt 240.0 lb

## 2021-12-22 DIAGNOSIS — R972 Elevated prostate specific antigen [PSA]: Secondary | ICD-10-CM | POA: Diagnosis not present

## 2021-12-22 LAB — URINALYSIS, COMPLETE
Bilirubin, UA: NEGATIVE
Glucose, UA: NEGATIVE
Ketones, UA: NEGATIVE
Leukocytes,UA: NEGATIVE
Nitrite, UA: NEGATIVE
Protein,UA: NEGATIVE
RBC, UA: NEGATIVE
Specific Gravity, UA: 1.01 (ref 1.005–1.030)
Urobilinogen, Ur: 0.2 mg/dL (ref 0.2–1.0)
pH, UA: 7 (ref 5.0–7.5)

## 2021-12-22 LAB — MICROSCOPIC EXAMINATION: Bacteria, UA: NONE SEEN

## 2021-12-22 NOTE — Progress Notes (Signed)
12/22/2021 9:49 AM   Ian Gibson 1945-10-20 [id number]  Referring provider: Jodi Marble, MD [place],  [place] [address]  Chief Complaint  Patient presents with   Elevated PSA    HPI: Ian Gibson is a 76 y.o. male referred for evaluation of an elevated PSA.  He presents with his spouse  PSA 11/06/2021 was 16.1 (13% free PSA).  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  "cured"   Hypertension    Seizure ([place])    on Keppra.  No seizures in "many" years.    Surgical History: Past Surgical History:  Procedure Laterality Date   CATARACT EXTRACTION W/PHACO Right 09/16/2021   Procedure:  7.44 00:54.4;  Surgeon: Birder Robson, MD;  Location: [facility];  Service: Ophthalmology;  Laterality: Right;   COLONOSCOPY     ESOPHAGOGASTRODUODENOSCOPY      Home Medications:  Allergies as of 12/22/2021   No Known Allergies      Medication List        Accurate as of December 22, 2021  9:49 AM. If you have any questions, ask your nurse or doctor.          aspirin EC 81 MG tablet Take by mouth.   atorvastatin 40 MG tablet Commonly known as: LIPITOR SMARTSIG:1 Tablet(s) By Mouth Every Evening   cetirizine 10 MG tablet Commonly known as: ZYRTEC TK 1 T PO QAM   chlorthalidone 25 MG tablet Commonly known as: HYGROTON TK 1/2 TO 1 T PO D UTD   clobetasol cream 0.05 % Commonly known as: TEMOVATE APP THIN FILM AA ON BACK BID UNTIL CLEAR   fluticasone 50 MCG/ACT nasal spray Commonly known as: FLONASE Place into both nostrils  daily.   gabapentin 800 MG tablet Commonly known as: NEURONTIN Take 800 mg by mouth 3 (three) times daily.   levETIRAcetam 500 MG tablet Commonly known as: KEPPRA Take 500 mg by mouth 2 (two) times daily.   lisinopril 20 MG tablet Commonly known as: ZESTRIL Take 20 mg by mouth daily.   losartan 25 MG tablet Commonly known as: COZAAR Take 25 mg by mouth daily.   nadolol 40 MG tablet Commonly known as: CORGARD Take 1 tablet (40 mg total) by mouth daily.   zolpidem 10 MG tablet Commonly known as: AMBIEN Take 10 mg by mouth at bedtime as needed.        Allergies: No Known Allergies  Family History: No family history on file.  Social History:  reports that he has never smoked. He has never used smokeless tobacco. He reports that he does not drink alcohol and does not use drugs.   Physical Exam: BP (!) 175/92   Pulse 66   Ht '6\' 1"'$  (1.854 m)   Wt 240 lb (108.9 kg)   BMI 31.66 kg/m   Constitutional:  Alert and oriented, No acute distress. HEENT: [place] AT Respiratory: Normal respiratory effort, no increased work of breathing. GU: Prostate 40 g, smooth without nodules Skin: No rashes, bruises or suspicious lesions. Neurologic: Grossly intact, no focal deficits, moving  all 4 extremities. Psychiatric: Normal mood and affect.   Assessment & Plan:    1.  Elevated PSA Although PSA is a prostate cancer screening test he was informed that cancer is not the most common cause of an elevated PSA. Other potential causes including BPH and inflammation were discussed. He was informed that the only way to adequately diagnose prostate cancer would be a transrectal ultrasound and biopsy of the prostate. The procedure was discussed including potential risks of bleeding and infection/sepsis. He was also informed that a negative biopsy does not conclusively rule out the possibility that prostate cancer may be present and that continued monitoring is required. The use of newer adjunctive  blood tests including 4kScore was discussed. The use of multiparametric prostate MRI to evaluate for abnormality suspicious for high-grade prostate cancer and aid in targeted biopsy was reviewed. Continued periodic surveillance was also discussed but not recommended for a PSA of this level. [facility] was contacted and last PSA results over the past 5 years was requested.  His only other PSA was 09/21/2021 which was 18.1.  The patient will be contacted with this information.  Recommend prostate biopsy though MRI could be performed to evaluate for high-grade lesions   Abbie Sons, MD  [place] [address] [place], [place] [address] [phone]

## 2021-12-23 ENCOUNTER — Encounter: Payer: Self-pay | Admitting: Gastroenterology

## 2021-12-23 ENCOUNTER — Ambulatory Visit (INDEPENDENT_AMBULATORY_CARE_PROVIDER_SITE_OTHER): Payer: Medicare Other | Admitting: Gastroenterology

## 2021-12-23 VITALS — BP 207/108 | HR 49 | Temp 98.4°F | Wt 244.0 lb

## 2021-12-23 DIAGNOSIS — K7460 Unspecified cirrhosis of liver: Secondary | ICD-10-CM

## 2021-12-23 DIAGNOSIS — B182 Chronic viral hepatitis C: Secondary | ICD-10-CM

## 2021-12-23 MED ORDER — NADOLOL 40 MG PO TABS: 40.0000 mg | ORAL_TABLET | Freq: Every day | ORAL | 1 refills | Status: DC

## 2021-12-23 NOTE — Patient Instructions (Addendum)
If you need to cancel or reschedule the ultrasound, please call [phone]  You will need to go to [facility] entrance on Monday, September 25th, arrive at 8:30am.   You can not have anything to eat or drink after 12 midnight the day before.  Please follow up with PCP regarding elevated BP readings

## 2021-12-23 NOTE — Progress Notes (Signed)
Primary Care Physician: Jodi Marble, MD  Primary Gastroenterologist:  Dr. Lucilla Lame  Chief Complaint  Patient presents with   Cirrhosis   Follow-up   Medication Refill    Nadolol    HPI: Ian Gibson is a 76 y.o. male here for follow-up after being diagnosed with hepatitis C cirrhosis and had treatment by Dr. At the [facility] many years ago.  The patient has been doing well without any problems.  The patient has not followed up for his [facility] surveillance.  He is not reporting any abdominal pain nausea vomiting fevers or chills.  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  "cured"   Hypertension    Seizure ([place])    on Keppra.  No seizures in "many" years.    Current Outpatient Medications  Medication Sig Dispense Refill   aspirin EC 81 MG tablet Take by mouth.     atorvastatin (LIPITOR) 40 MG tablet SMARTSIG:1 Tablet(s) By Mouth Every Evening     cetirizine (ZYRTEC) 10 MG tablet TK 1 T PO QAM     chlorthalidone (HYGROTON) 25 MG tablet TK 1/2 TO 1 T PO D UTD  0   clobetasol cream (TEMOVATE) 0.05 % APP THIN FILM AA ON BACK BID UNTIL CLEAR  2   fluticasone (FLONASE) 50 MCG/ACT nasal spray Place into both nostrils daily.     gabapentin (NEURONTIN) 800 MG tablet Take 800 mg by mouth 3 (three) times daily.     levETIRAcetam (KEPPRA) 500 MG tablet Take 500 mg by mouth 2 (two) times daily.     lisinopril (ZESTRIL) 20 MG tablet Take 20 mg by mouth daily.     losartan (COZAAR) 25 MG tablet Take 25 mg by mouth daily.     nadolol (CORGARD) 40 MG tablet Take 1 tablet (40 mg total) by mouth daily. 30 tablet 5   zolpidem (AMBIEN) 10 MG tablet Take 10 mg by mouth at bedtime as needed.     No current facility-administered medications for this visit.    Allergies as of 12/23/2021   (No Known Allergies)    ROS:  General: Negative for anorexia, weight  loss, fever, chills, fatigue, weakness. ENT: Negative for hoarseness, difficulty swallowing , nasal congestion. CV: Negative for chest pain, angina, palpitations, dyspnea on exertion, peripheral edema.  Respiratory: Negative for dyspnea at rest, dyspnea on exertion, cough, sputum, wheezing.  GI: See history of present illness. GU:  Negative for dysuria, hematuria, urinary incontinence, urinary frequency, nocturnal urination.  Endo: Negative for unusual weight change.    Physical Examination:   BP (!) 201/87   Pulse (!) 49   Temp 98.4 F (36.9 C) (Oral)   Wt 244 lb (110.7 kg)   BMI 32.19 kg/m   General: Well-nourished, well-developed in no acute distress.  Eyes: No icterus. Conjunctivae pink. Extremities: No lower extremity edema. No clubbing or deformities. Neuro: Alert and oriented x 3.  Grossly intact. Skin: Warm and dry, no jaundice.   Psych: Alert and cooperative, normal mood and affect.  Labs:    Imaging Studies: No results found.  Assessment and Plan:   Ian Gibson is a 76 y.o. y/o male who comes in with a history of cirrhosis.  The patient needs a refill of his nadolol.  He has no issues at the present time.  The patient has been  told that he needs a ultrasound and alpha-fetoprotein every 6 months for [facility] surveillance.  The patient will be set up for this at the present time.  The patient has been explained the plan and agrees with it.     Lucilla Lame, MD. [facility]    Note: This dictation was prepared with Dragon dictation along with smaller phrase technology. Any transcriptional errors that result from this process are unintentional.

## 2021-12-24 LAB — AFP TUMOR MARKER: AFP, Serum, Tumor Marker: 3.6 ng/mL (ref 0.0–8.4)

## 2021-12-29 ENCOUNTER — Ambulatory Visit
Admission: RE | Admit: 2021-12-29 | Discharge: 2021-12-29 | Disposition: A | Discharge status: Home / Self Care | Payer: Medicare Other | Source: Ambulatory Visit | Attending: Gastroenterology | Admitting: Gastroenterology

## 2021-12-29 DIAGNOSIS — B182 Chronic viral hepatitis C: Secondary | ICD-10-CM | POA: Diagnosis not present

## 2021-12-29 DIAGNOSIS — K7460 Unspecified cirrhosis of liver: Secondary | ICD-10-CM | POA: Insufficient documentation

## 2022-01-20 ENCOUNTER — Telehealth: Payer: Self-pay | Admitting: Urology

## 2022-01-20 NOTE — Telephone Encounter (Signed)
Pt's wife called office, said she s/w Lattie Haw on 10/9 asking the next steps they should take.  They didn't know if he needed a biopsy or more imaging.  I saw the message in his chart, but no one has contacted them.

## 2022-01-21 NOTE — Telephone Encounter (Signed)
No previous PSA results seen from records sent over from [facility].  Recommend scheduling prostate biopsy

## 2022-01-22 NOTE — Telephone Encounter (Signed)
Spoke with wife and ordered prostate biopsy, also mailed prostate biopsy instructions to home address.

## 2022-02-05 ENCOUNTER — Ambulatory Visit (INDEPENDENT_AMBULATORY_CARE_PROVIDER_SITE_OTHER): Payer: Medicare Other | Admitting: Urology

## 2022-02-05 ENCOUNTER — Encounter: Payer: Self-pay | Admitting: Urology

## 2022-02-05 VITALS — Ht 72.0 in | Wt 244.0 lb

## 2022-02-05 DIAGNOSIS — R972 Elevated prostate specific antigen [PSA]: Secondary | ICD-10-CM

## 2022-02-05 DIAGNOSIS — C61 Malignant neoplasm of prostate: Secondary | ICD-10-CM | POA: Diagnosis not present

## 2022-02-05 MED ORDER — LEVOFLOXACIN 500 MG PO TABS
500.0000 mg | ORAL_TABLET | Freq: Once | ORAL | Status: AC
  Administered 2022-02-05: 500 mg via ORAL

## 2022-02-05 MED ORDER — GENTAMICIN SULFATE 40 MG/ML IJ SOLN
80.0000 mg | Freq: Once | INTRAMUSCULAR | Status: AC
  Administered 2022-02-05: 80 mg via INTRAMUSCULAR

## 2022-02-05 NOTE — Progress Notes (Signed)
   Prostate Biopsy Procedure   Informed consent was obtained after discussing risks/benefits of the procedure.  A time out was performed to ensure correct patient identity.  Pre-Procedure: - Last PSA Level: 16.1 (13% free) 11/06/2021 - Gentamicin given prophylactically - Levaquin 500 mg administered PO -Transrectal Ultrasound performed revealing a 54 gm prostate -No significant hypoechoic or median lobe noted  Procedure: - Prostate block performed using 10 cc 1% lidocaine and biopsies taken from sextant areas, a total of 12 under ultrasound guidance.  Post-Procedure: - Patient tolerated the procedure well - He was counseled to seek immediate medical attention if experiences any severe pain, significant bleeding, or fevers - Return in one week to discuss biopsy results; will call patient when received   John Giovanni, MD

## 2022-02-05 NOTE — Addendum Note (Signed)
Addended by: Chrystie Nose on: 02/05/2022 01:04 PM   Modules accepted: Orders

## 2022-02-06 LAB — SURGICAL PATHOLOGY

## 2022-02-12 ENCOUNTER — Encounter: Payer: Self-pay | Admitting: Urology

## 2022-02-12 ENCOUNTER — Ambulatory Visit (INDEPENDENT_AMBULATORY_CARE_PROVIDER_SITE_OTHER): Payer: Medicare Other | Admitting: Urology

## 2022-02-12 VITALS — BP 165/89 | HR 64 | Ht 72.0 in | Wt 244.0 lb

## 2022-02-12 DIAGNOSIS — C61 Malignant neoplasm of prostate: Secondary | ICD-10-CM

## 2022-02-15 ENCOUNTER — Encounter: Payer: Self-pay | Admitting: Urology

## 2022-02-15 NOTE — Progress Notes (Signed)
02/12/2022 12:11 PM   Ian Gibson [birth year] [id number]  Referring provider: Jodi Marble, MD [place],  [place] [id number]  Chief Complaint  Patient presents with   Results    HPI: 76 y.o. male presents for prostate biopsy follow-up.  He had no postbiopsy complaints.  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  "cured"   Hypertension    Seizure ([place])    on Keppra.  No seizures in "many" years.    Surgical History: Past Surgical History:  Procedure Laterality Date   CATARACT EXTRACTION W/PHACO Right 09/16/2021   Procedure:  7.44 00:54.4;  Surgeon: Birder Robson, MD;  Location: [facility];  Service: Ophthalmology;  Laterality: Right;   COLONOSCOPY     ESOPHAGOGASTRODUODENOSCOPY      Home Medications:  Allergies as of 02/12/2022   No Known Allergies      Medication List        Accurate as of February 12, 2022 11:59 PM. If you have any questions, ask your nurse or doctor.          STOP taking these medications    chlorthalidone 25 MG tablet Commonly known as: HYGROTON Stopped by: Abbie Sons, MD   fluticasone 50 MCG/ACT nasal spray Commonly known as: FLONASE Stopped by: Abbie Sons, MD   losartan 25 MG tablet Commonly known as: COZAAR Stopped by: Abbie Sons, MD       TAKE these medications    aspirin EC 81 MG tablet Take by mouth.   atorvastatin 40 MG tablet Commonly known as: LIPITOR SMARTSIG:1 Tablet(s) By Mouth Every Evening   cetirizine 10 MG tablet Commonly known as: ZYRTEC TK 1 T PO QAM   clobetasol cream 0.05 % Commonly known as: TEMOVATE APP THIN FILM AA ON BACK BID UNTIL CLEAR   gabapentin 800 MG tablet Commonly known as: NEURONTIN Take 800 mg  by mouth 3 (three) times daily.   levETIRAcetam 500 MG tablet Commonly known as: KEPPRA Take 500 mg by mouth 2 (two) times daily.   lisinopril 40 MG tablet Commonly known as: ZESTRIL Take 40 mg by mouth every morning. What changed: Another medication with the same name was removed. Continue taking this medication, and follow the directions you see here. Changed by: Abbie Sons, MD   nadolol 40 MG tablet Commonly known as: CORGARD Take 1 tablet (40 mg total) by mouth daily.   zolpidem 10 MG tablet Commonly known as: AMBIEN Take 10 mg by mouth at bedtime as needed.        Allergies: No Known Allergies  Family History: No family history on file.  Social History:  reports that he has never smoked. He has never used smokeless tobacco. He reports that he does not drink alcohol and does not use drugs.   Physical Exam: BP (!) 165/89   Pulse 64   Ht 6' (1.829 m)   Wt 244 lb (110.7 kg)   BMI 33.09 kg/m   Constitutional:  Alert and oriented, No acute distress. Psychiatric: Normal mood and affect.   Assessment & Plan:    1.  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

## 2022-02-25 ENCOUNTER — Institutional Professional Consult (permissible substitution): Payer: Medicare Other | Admitting: Radiation Oncology

## 2022-03-10 ENCOUNTER — Ambulatory Visit
Admission: RE | Admit: 2022-03-10 | Discharge: 2022-03-10 | Disposition: A | Discharge status: Home / Self Care | Payer: Medicare Other | Source: Ambulatory Visit | Attending: Radiation Oncology | Admitting: Radiation Oncology

## 2022-03-10 ENCOUNTER — Encounter: Payer: Self-pay | Admitting: Radiation Oncology

## 2022-03-10 VITALS — Temp 95.9°F | Ht 73.0 in | Wt 245.0 lb

## 2022-03-10 DIAGNOSIS — Z510 Encounter for antineoplastic radiation therapy: Secondary | ICD-10-CM | POA: Diagnosis not present

## 2022-03-10 DIAGNOSIS — C61 Malignant neoplasm of prostate: Secondary | ICD-10-CM | POA: Diagnosis present

## 2022-03-10 NOTE — Consult Note (Signed)
NEW PATIENT EVALUATION  Name: Ian Gibson  MRN: [id number]  Date:   03/10/2022     DOB: 07-15-1945   This 77 y.o. 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  16 range  REFERRING PHYSICIAN: Jodi Marble, MD  CHIEF COMPLAINT:  Chief Complaint  Patient presents with   Prostate Cancer    Consult    DIAGNOSIS: The encounter diagnosis was Malignant neoplasm of prostate ([place]).   PREVIOUS INVESTIGATIONS:  Pathology report reviewed Clinical notes reviewed Bone scan ordered  HPI: Patient is a 76 year old male who presented with an elevated PSA of 16.1.  There were no prior PSA results.  He has some minor lower urinary tract symptoms no significant nocturia distant urea.  He was seen by Dr. Bernardo Gibson and underwent transrectal ultrasound-guided biopsy showing 2 of 12 cores positive for Gleason 7 (3+4) adenocarcinoma.  He has not had a bone scan at this point.  Treatment options were discussed with the patient and he is interested in radiation oncology evaluation.  PLANNED TREATMENT REGIMEN: Image guided IMRT radiation therapy with single dose of Eligard  PAST MEDICAL HISTORY:  has a past medical history of CKD (chronic kidney disease), Hepatic cirrhosis due to chronic hepatitis C infection ([place]) (2015), Hypertension, and Seizure ([place]).    PAST SURGICAL HISTORY:  Past Surgical History:  Procedure Laterality Date   CATARACT EXTRACTION W/PHACO Right 09/16/2021   Procedure:  7.44 00:54.4;  Surgeon: Ian Robson, MD;  Location: [facility];  Service: Ophthalmology;  Laterality: Right;   COLONOSCOPY     ESOPHAGOGASTRODUODENOSCOPY      FAMILY HISTORY: family history is not on file.  SOCIAL HISTORY:  reports that he has never smoked. He has never used smokeless tobacco. He reports that he does not drink alcohol and does not use drugs.  ALLERGIES: Patient has  no known allergies.  MEDICATIONS:  Current Outpatient Medications  Medication Sig Dispense Refill   aspirin EC 81 MG tablet Take by mouth.     atorvastatin (LIPITOR) 40 MG tablet SMARTSIG:1 Tablet(s) By Mouth Every Evening     cetirizine (ZYRTEC) 10 MG tablet TK 1 T PO QAM     clobetasol cream (TEMOVATE) 0.05 % APP THIN FILM AA ON BACK BID UNTIL CLEAR  2   gabapentin (NEURONTIN) 800 MG tablet Take 800 mg by mouth 3 (three) times daily.     levETIRAcetam (KEPPRA) 500 MG tablet Take 500 mg by mouth 2 (two) times daily.     lisinopril (ZESTRIL) 40 MG tablet Take 40 mg by mouth every morning.     nadolol (CORGARD) 40 MG tablet Take 1 tablet (40 mg total) by mouth daily. 90 tablet 1   zolpidem (AMBIEN) 10 MG tablet Take 10 mg by mouth at bedtime as needed.     No current facility-administered medications for this encounter.    ECOG PERFORMANCE STATUS:  0 - Asymptomatic  REVIEW OF SYSTEMS: Patient denies any weight loss, fatigue, weakness, fever, chills or night sweats. Patient denies any loss of vision, blurred vision. Patient denies any ringing  of the ears or hearing loss. No irregular heartbeat. Patient denies heart murmur or history of fainting. Patient denies any chest pain or pain radiating to her upper extremities. Patient denies any shortness of breath, difficulty breathing at night, cough or hemoptysis. Patient denies any swelling in the lower legs. Patient denies any nausea  vomiting, vomiting of blood, or coffee ground material in the vomitus. Patient denies any stomach pain. Patient states has had normal bowel movements no significant constipation or diarrhea. Patient denies any dysuria, hematuria or significant nocturia. Patient denies any problems walking, swelling in the joints or loss of balance. Patient denies any skin changes, loss of hair or loss of weight. Patient denies any excessive worrying or anxiety or significant depression. Patient denies any problems with insomnia. Patient  denies excessive thirst, polyuria, polydipsia. Patient denies any swollen glands, patient denies easy bruising or easy bleeding. Patient denies any recent infections, allergies or URI. Patient "s visual fields have not changed significantly in recent time.   PHYSICAL EXAM: Temp (!) 95.9 F (35.5 C)   Ht '6\' 1"'$  (1.854 m)   Wt 245 lb (111.1 kg)   BMI 32.32 kg/m  Well-developed well-nourished patient in NAD. HEENT reveals PERLA, EOMI, discs not visualized.  Oral cavity is clear. No oral mucosal lesions are identified. Neck is clear without evidence of cervical or supraclavicular adenopathy. Lungs are clear to A&P. Cardiac examination is essentially unremarkable with regular rate and rhythm without murmur rub or thrill. Abdomen is benign with no organomegaly or masses noted. Motor sensory and DTR levels are equal and symmetric in the upper and lower extremities. Cranial nerves II through XII are grossly intact. Proprioception is intact. No peripheral adenopathy or edema is identified. No motor or sensory levels are noted. Crude visual fields are within normal range.  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  Patient is interested in external beam IMRT treatment.  Risks and benefits of treatment occluding increased lower Neri tract symptoms diarrhea fatigue alteration of blood counts all were discussed in detail with the patient.  I asked Dr. Bernardo Gibson to place fiducial markers for daily image guided treatment I have also asked Dr. Bernardo Gibson to give the patient one 26-monthdepot of Eligard.  We will set up simulation after his markers are placed.  Patient and wife both comprehend my treatment plan  well.  I would like to take this opportunity to thank you for allowing me to participate in the care of your patient..Ian Filbert MD

## 2022-03-11 ENCOUNTER — Telehealth: Payer: Self-pay

## 2022-03-11 NOTE — Telephone Encounter (Signed)
-----   Message from Despina Hidden, [place] sent at 03/10/2022 11:26 AM EST -----  ----- Message ----- From: Christean Grief, RN Sent: 03/10/2022   9:46 AM EST To: Manus Rudd, RN; [facility]  Good Morning,   This patient will need Eligard and Markers placed.  He and his wife would like early morning appointments and no Wednesdays.    Thanks,  Ranelle Oyster,

## 2022-03-11 NOTE — Telephone Encounter (Signed)
Called pt scheduled him for marker placement and Eligard injection. No PA required for Eligard. Pt informed to complete enema prior to appt and advised to bring markers with him.

## 2022-03-24 ENCOUNTER — Ambulatory Visit
Admission: RE | Admit: 2022-03-24 | Discharge: 2022-03-24 | Disposition: A | Discharge status: Home / Self Care | Payer: Medicare Other | Source: Ambulatory Visit | Attending: Radiation Oncology | Admitting: Radiation Oncology

## 2022-03-24 ENCOUNTER — Encounter
Admission: RE | Admit: 2022-03-24 | Discharge: 2022-03-24 | Disposition: A | Discharge status: Home / Self Care | Payer: Medicare Other | Source: Ambulatory Visit | Attending: Radiation Oncology | Admitting: Radiation Oncology

## 2022-03-24 DIAGNOSIS — C61 Malignant neoplasm of prostate: Secondary | ICD-10-CM | POA: Insufficient documentation

## 2022-03-24 MED ORDER — TECHNETIUM TC 99M MEDRONATE IV KIT
20.0000 | PACK | Freq: Once | INTRAVENOUS | Status: AC | PRN
  Administered 2022-03-24: 21.77 via INTRAVENOUS

## 2022-03-26 ENCOUNTER — Encounter: Payer: Self-pay | Admitting: Urology

## 2022-03-26 ENCOUNTER — Ambulatory Visit (INDEPENDENT_AMBULATORY_CARE_PROVIDER_SITE_OTHER): Payer: Medicare Other | Admitting: Urology

## 2022-03-26 VITALS — BP 182/73 | HR 76 | Ht 73.0 in | Wt 244.0 lb

## 2022-03-26 DIAGNOSIS — C61 Malignant neoplasm of prostate: Secondary | ICD-10-CM

## 2022-03-26 MED ORDER — LEUPROLIDE ACETATE (6 MONTH) 45 MG ~~LOC~~ KIT
45.0000 mg | PACK | Freq: Once | SUBCUTANEOUS | Status: AC
  Administered 2022-03-26: 45 mg via SUBCUTANEOUS

## 2022-03-26 MED ORDER — LEVOFLOXACIN 500 MG PO TABS
500.0000 mg | ORAL_TABLET | Freq: Once | ORAL | Status: AC
  Administered 2022-03-26: 500 mg via ORAL

## 2022-03-26 MED ORDER — GENTAMICIN SULFATE 40 MG/ML IJ SOLN
80.0000 mg | Freq: Once | INTRAMUSCULAR | Status: AC
  Administered 2022-03-26: 80 mg via INTRAMUSCULAR

## 2022-03-26 NOTE — Patient Instructions (Signed)
continue on Vitamin D 800-1000iu and Calcium 1000-'1200mg'$  daily while on Androgen Deprivation Therapy.

## 2022-03-26 NOTE — Progress Notes (Signed)
03/26/22  CC: gold fiducial marker placement  HPI: 76 y.o. male with prostate cancer who presents today for placement of fiducial seed markers in anticipation of his upcoming IMRT with Dr. Baruch Gouty.  Prostate Gold fiducial Marker Placement Procedure   Informed consent was obtained after discussing risks/benefits of the procedure.  A time out was performed to ensure correct patient identity.  Pre-Procedure: - Gentamicin given prophylactically - PO Levaquin 500 mg also given today  Procedure: - Lidocaine jelly was administered per rectum - Rectal ultrasound probe was placed without difficulty and the prostate visualized - Prostatic block performed with 10 mL 1% Xylocaine - 3 fiducial gold seed markers placed, one at right base, one at left base, one at apex of prostate gland under transrectal ultrasound guidance  Post-Procedure: - Patient tolerated the procedure well - He was counseled to seek immediate medical attention if experiences any severe pain, significant bleeding, or fevers -He also received a Depo Lupron injection.  One 64-monthinjection was recommended by radiation oncology.  Side effects were discussed    SJohn Giovanni MD

## 2022-03-26 NOTE — Progress Notes (Signed)
Eligard SubQ Injection   Due to Prostate Cancer patient is present today for a Eligard Injection.  Medication: Eligard 6 month Dose: 45 mg  Location: left  Lot: [id number] Exp: 04/07/2023  Patient tolerated well, no complications were noted  Performed by: Elberta Leatherwood, [place]  Per Dr. Baruch Gouty patient is to continue therapy for 6 months. This appointment was scheduled using wheel and given to patient today along with reminder continue on Vitamin D 800-1000iu and Calcium 1000-'1200mg'$  daily while on Androgen Deprivation Therapy.  PA approval dates:

## 2022-03-27 ENCOUNTER — Encounter: Payer: Self-pay | Admitting: Urology

## 2022-03-31 ENCOUNTER — Ambulatory Visit
Admission: RE | Admit: 2022-03-31 | Discharge: 2022-03-31 | Disposition: A | Discharge status: Home / Self Care | Payer: Medicare Other | Source: Ambulatory Visit | Attending: Radiation Oncology | Admitting: Radiation Oncology

## 2022-03-31 DIAGNOSIS — Z510 Encounter for antineoplastic radiation therapy: Secondary | ICD-10-CM | POA: Diagnosis not present

## 2022-04-01 ENCOUNTER — Ambulatory Visit: Payer: Medicare Other

## 2022-04-01 DIAGNOSIS — Z510 Encounter for antineoplastic radiation therapy: Secondary | ICD-10-CM | POA: Diagnosis not present

## 2022-04-02 ENCOUNTER — Ambulatory Visit: Payer: Medicare Other

## 2022-04-10 ENCOUNTER — Other Ambulatory Visit: Payer: Self-pay | Admitting: *Deleted

## 2022-04-10 DIAGNOSIS — C61 Malignant neoplasm of prostate: Secondary | ICD-10-CM

## 2022-04-13 ENCOUNTER — Ambulatory Visit
Admission: RE | Admit: 2022-04-13 | Discharge: 2022-04-13 | Disposition: A | Discharge status: Home / Self Care | Payer: Medicare Other | Source: Ambulatory Visit | Attending: Radiation Oncology | Admitting: Radiation Oncology

## 2022-04-13 DIAGNOSIS — C61 Malignant neoplasm of prostate: Secondary | ICD-10-CM | POA: Insufficient documentation

## 2022-04-13 DIAGNOSIS — Z510 Encounter for antineoplastic radiation therapy: Secondary | ICD-10-CM | POA: Insufficient documentation

## 2022-04-14 ENCOUNTER — Ambulatory Visit
Admission: RE | Admit: 2022-04-14 | Discharge: 2022-04-14 | Disposition: A | Discharge status: Home / Self Care | Payer: Medicare Other | Source: Ambulatory Visit | Attending: Radiation Oncology | Admitting: Radiation Oncology

## 2022-04-14 ENCOUNTER — Other Ambulatory Visit: Payer: Self-pay

## 2022-04-14 DIAGNOSIS — Z510 Encounter for antineoplastic radiation therapy: Secondary | ICD-10-CM | POA: Diagnosis not present

## 2022-04-14 DIAGNOSIS — C61 Malignant neoplasm of prostate: Secondary | ICD-10-CM | POA: Diagnosis present

## 2022-04-14 LAB — RAD ONC ARIA SESSION SUMMARY
Course Elapsed Days: 0
Plan Fractions Treated to Date: 1
Plan Prescribed Dose Per Fraction: 2 Gy
Plan Total Fractions Prescribed: 40
Plan Total Prescribed Dose: 80 Gy
Reference Point Dosage Given to Date: 2 Gy
Reference Point Session Dosage Given: 2 Gy
Session Number: 1

## 2022-04-15 ENCOUNTER — Ambulatory Visit
Admission: RE | Admit: 2022-04-15 | Discharge: 2022-04-15 | Disposition: A | Discharge status: Home / Self Care | Payer: Medicare Other | Source: Ambulatory Visit | Attending: Radiation Oncology | Admitting: Radiation Oncology

## 2022-04-15 ENCOUNTER — Other Ambulatory Visit: Payer: Self-pay

## 2022-04-15 DIAGNOSIS — Z510 Encounter for antineoplastic radiation therapy: Secondary | ICD-10-CM | POA: Diagnosis not present

## 2022-04-15 LAB — RAD ONC ARIA SESSION SUMMARY
Course Elapsed Days: 1
Plan Fractions Treated to Date: 2
Plan Prescribed Dose Per Fraction: 2 Gy
Plan Total Fractions Prescribed: 40
Plan Total Prescribed Dose: 80 Gy
Reference Point Dosage Given to Date: 4 Gy
Reference Point Session Dosage Given: 2 Gy
Session Number: 2

## 2022-04-16 ENCOUNTER — Ambulatory Visit: Payer: Medicare Other

## 2022-04-17 ENCOUNTER — Ambulatory Visit: Payer: Medicare Other

## 2022-04-20 ENCOUNTER — Ambulatory Visit
Admission: RE | Admit: 2022-04-20 | Discharge: 2022-04-20 | Disposition: A | Discharge status: Home / Self Care | Payer: Medicare Other | Source: Ambulatory Visit | Attending: Radiation Oncology | Admitting: Radiation Oncology

## 2022-04-20 ENCOUNTER — Other Ambulatory Visit: Payer: Self-pay

## 2022-04-20 ENCOUNTER — Other Ambulatory Visit: Payer: Self-pay | Admitting: Gastroenterology

## 2022-04-20 DIAGNOSIS — Z510 Encounter for antineoplastic radiation therapy: Secondary | ICD-10-CM | POA: Diagnosis not present

## 2022-04-20 LAB — RAD ONC ARIA SESSION SUMMARY
Course Elapsed Days: 6
Plan Fractions Treated to Date: 3
Plan Prescribed Dose Per Fraction: 2 Gy
Plan Total Fractions Prescribed: 40
Plan Total Prescribed Dose: 80 Gy
Reference Point Dosage Given to Date: 6 Gy
Reference Point Session Dosage Given: 2 Gy
Session Number: 3

## 2022-04-21 ENCOUNTER — Ambulatory Visit
Admission: RE | Admit: 2022-04-21 | Discharge: 2022-04-21 | Disposition: A | Discharge status: Home / Self Care | Payer: Medicare Other | Source: Ambulatory Visit | Attending: Radiation Oncology | Admitting: Radiation Oncology

## 2022-04-21 ENCOUNTER — Other Ambulatory Visit: Payer: Self-pay

## 2022-04-21 DIAGNOSIS — Z510 Encounter for antineoplastic radiation therapy: Secondary | ICD-10-CM | POA: Diagnosis not present

## 2022-04-21 LAB — RAD ONC ARIA SESSION SUMMARY
Course Elapsed Days: 7
Plan Fractions Treated to Date: 4
Plan Prescribed Dose Per Fraction: 2 Gy
Plan Total Fractions Prescribed: 40
Plan Total Prescribed Dose: 80 Gy
Reference Point Dosage Given to Date: 8 Gy
Reference Point Session Dosage Given: 2 Gy
Session Number: 4

## 2022-04-22 ENCOUNTER — Ambulatory Visit
Admission: RE | Admit: 2022-04-22 | Discharge: 2022-04-22 | Disposition: A | Discharge status: Home / Self Care | Payer: Medicare Other | Source: Ambulatory Visit | Attending: Radiation Oncology | Admitting: Radiation Oncology

## 2022-04-22 ENCOUNTER — Other Ambulatory Visit: Payer: Self-pay

## 2022-04-22 DIAGNOSIS — Z510 Encounter for antineoplastic radiation therapy: Secondary | ICD-10-CM | POA: Diagnosis not present

## 2022-04-22 LAB — RAD ONC ARIA SESSION SUMMARY
Course Elapsed Days: 8
Plan Fractions Treated to Date: 5
Plan Prescribed Dose Per Fraction: 2 Gy
Plan Total Fractions Prescribed: 40
Plan Total Prescribed Dose: 80 Gy
Reference Point Dosage Given to Date: 10 Gy
Reference Point Session Dosage Given: 2 Gy
Session Number: 5

## 2022-04-23 ENCOUNTER — Other Ambulatory Visit: Payer: Self-pay

## 2022-04-23 ENCOUNTER — Ambulatory Visit
Admission: RE | Admit: 2022-04-23 | Discharge: 2022-04-23 | Disposition: A | Discharge status: Home / Self Care | Payer: Medicare Other | Source: Ambulatory Visit | Attending: Radiation Oncology | Admitting: Radiation Oncology

## 2022-04-23 DIAGNOSIS — Z510 Encounter for antineoplastic radiation therapy: Secondary | ICD-10-CM | POA: Diagnosis not present

## 2022-04-23 LAB — RAD ONC ARIA SESSION SUMMARY
Course Elapsed Days: 9
Plan Fractions Treated to Date: 6
Plan Prescribed Dose Per Fraction: 2 Gy
Plan Total Fractions Prescribed: 40
Plan Total Prescribed Dose: 80 Gy
Reference Point Dosage Given to Date: 12 Gy
Reference Point Session Dosage Given: 2 Gy
Session Number: 6

## 2022-04-24 ENCOUNTER — Ambulatory Visit
Admission: RE | Admit: 2022-04-24 | Discharge: 2022-04-24 | Disposition: A | Discharge status: Home / Self Care | Payer: Medicare Other | Source: Ambulatory Visit | Attending: Radiation Oncology | Admitting: Radiation Oncology

## 2022-04-24 ENCOUNTER — Other Ambulatory Visit: Payer: Self-pay

## 2022-04-24 DIAGNOSIS — Z510 Encounter for antineoplastic radiation therapy: Secondary | ICD-10-CM | POA: Diagnosis not present

## 2022-04-24 LAB — RAD ONC ARIA SESSION SUMMARY
Course Elapsed Days: 10
Plan Fractions Treated to Date: 7
Plan Prescribed Dose Per Fraction: 2 Gy
Plan Total Fractions Prescribed: 40
Plan Total Prescribed Dose: 80 Gy
Reference Point Dosage Given to Date: 14 Gy
Reference Point Session Dosage Given: 2 Gy
Session Number: 7

## 2022-04-27 ENCOUNTER — Other Ambulatory Visit: Payer: Self-pay

## 2022-04-27 ENCOUNTER — Ambulatory Visit
Admission: RE | Admit: 2022-04-27 | Discharge: 2022-04-27 | Disposition: A | Discharge status: Home / Self Care | Payer: Medicare Other | Source: Ambulatory Visit | Attending: Radiation Oncology | Admitting: Radiation Oncology

## 2022-04-27 DIAGNOSIS — Z510 Encounter for antineoplastic radiation therapy: Secondary | ICD-10-CM | POA: Diagnosis not present

## 2022-04-27 LAB — RAD ONC ARIA SESSION SUMMARY
Course Elapsed Days: 13
Plan Fractions Treated to Date: 8
Plan Prescribed Dose Per Fraction: 2 Gy
Plan Total Fractions Prescribed: 40
Plan Total Prescribed Dose: 80 Gy
Reference Point Dosage Given to Date: 16 Gy
Reference Point Session Dosage Given: 2 Gy
Session Number: 8

## 2022-04-28 ENCOUNTER — Other Ambulatory Visit: Payer: Self-pay

## 2022-04-28 ENCOUNTER — Ambulatory Visit
Admission: RE | Admit: 2022-04-28 | Discharge: 2022-04-28 | Disposition: A | Discharge status: Home / Self Care | Payer: Medicare Other | Source: Ambulatory Visit | Attending: Radiation Oncology | Admitting: Radiation Oncology

## 2022-04-28 DIAGNOSIS — Z510 Encounter for antineoplastic radiation therapy: Secondary | ICD-10-CM | POA: Diagnosis not present

## 2022-04-28 LAB — RAD ONC ARIA SESSION SUMMARY
Course Elapsed Days: 14
Plan Fractions Treated to Date: 9
Plan Prescribed Dose Per Fraction: 2 Gy
Plan Total Fractions Prescribed: 40
Plan Total Prescribed Dose: 80 Gy
Reference Point Dosage Given to Date: 18 Gy
Reference Point Session Dosage Given: 2 Gy
Session Number: 9

## 2022-04-29 ENCOUNTER — Inpatient Hospital Stay: Payer: Medicare Other

## 2022-04-29 ENCOUNTER — Ambulatory Visit
Admission: RE | Admit: 2022-04-29 | Discharge: 2022-04-29 | Disposition: A | Discharge status: Home / Self Care | Payer: Medicare Other | Source: Ambulatory Visit | Attending: Radiation Oncology | Admitting: Radiation Oncology

## 2022-04-29 ENCOUNTER — Other Ambulatory Visit: Payer: Self-pay

## 2022-04-29 DIAGNOSIS — Z510 Encounter for antineoplastic radiation therapy: Secondary | ICD-10-CM | POA: Diagnosis not present

## 2022-04-29 DIAGNOSIS — C61 Malignant neoplasm of prostate: Secondary | ICD-10-CM | POA: Insufficient documentation

## 2022-04-29 LAB — CBC
HCT: 45.5 % (ref 39.0–52.0)
Hemoglobin: 14.8 g/dL (ref 13.0–17.0)
MCH: 31 pg (ref 26.0–34.0)
MCHC: 32.5 g/dL (ref 30.0–36.0)
MCV: 95.4 fL (ref 80.0–100.0)
Platelets: 255 10*3/uL (ref 150–400)
RBC: 4.77 MIL/uL (ref 4.22–5.81)
RDW: 13.2 % (ref 11.5–15.5)
WBC: 6.4 10*3/uL (ref 4.0–10.5)
nRBC: 0 % (ref 0.0–0.2)

## 2022-04-29 LAB — RAD ONC ARIA SESSION SUMMARY
Course Elapsed Days: 15
Plan Fractions Treated to Date: 10
Plan Prescribed Dose Per Fraction: 2 Gy
Plan Total Fractions Prescribed: 40
Plan Total Prescribed Dose: 80 Gy
Reference Point Dosage Given to Date: 20 Gy
Reference Point Session Dosage Given: 2 Gy
Session Number: 10

## 2022-04-30 ENCOUNTER — Ambulatory Visit
Admission: RE | Admit: 2022-04-30 | Discharge: 2022-04-30 | Disposition: A | Discharge status: Home / Self Care | Payer: Medicare Other | Source: Ambulatory Visit | Attending: Radiation Oncology | Admitting: Radiation Oncology

## 2022-04-30 ENCOUNTER — Other Ambulatory Visit: Payer: Self-pay

## 2022-04-30 DIAGNOSIS — Z510 Encounter for antineoplastic radiation therapy: Secondary | ICD-10-CM | POA: Diagnosis not present

## 2022-04-30 LAB — RAD ONC ARIA SESSION SUMMARY
Course Elapsed Days: 16
Plan Fractions Treated to Date: 11
Plan Prescribed Dose Per Fraction: 2 Gy
Plan Total Fractions Prescribed: 40
Plan Total Prescribed Dose: 80 Gy
Reference Point Dosage Given to Date: 22 Gy
Reference Point Session Dosage Given: 2 Gy
Session Number: 11

## 2022-05-01 ENCOUNTER — Other Ambulatory Visit: Payer: Self-pay

## 2022-05-01 ENCOUNTER — Ambulatory Visit
Admission: RE | Admit: 2022-05-01 | Discharge: 2022-05-01 | Disposition: A | Discharge status: Home / Self Care | Payer: Medicare Other | Source: Ambulatory Visit | Attending: Radiation Oncology | Admitting: Radiation Oncology

## 2022-05-01 DIAGNOSIS — Z510 Encounter for antineoplastic radiation therapy: Secondary | ICD-10-CM | POA: Diagnosis not present

## 2022-05-01 LAB — RAD ONC ARIA SESSION SUMMARY
Course Elapsed Days: 17
Plan Fractions Treated to Date: 12
Plan Prescribed Dose Per Fraction: 2 Gy
Plan Total Fractions Prescribed: 40
Plan Total Prescribed Dose: 80 Gy
Reference Point Dosage Given to Date: 24 Gy
Reference Point Session Dosage Given: 2 Gy
Session Number: 12

## 2022-05-04 ENCOUNTER — Other Ambulatory Visit: Payer: Self-pay

## 2022-05-04 ENCOUNTER — Ambulatory Visit: Admission: RE | Admit: 2022-05-04 | Payer: Medicare Other | Source: Ambulatory Visit

## 2022-05-04 ENCOUNTER — Ambulatory Visit
Admission: RE | Admit: 2022-05-04 | Discharge: 2022-05-04 | Disposition: A | Discharge status: Home / Self Care | Payer: Medicare Other | Source: Ambulatory Visit | Attending: Radiation Oncology | Admitting: Radiation Oncology

## 2022-05-04 ENCOUNTER — Ambulatory Visit: Payer: Medicare Other

## 2022-05-04 DIAGNOSIS — Z510 Encounter for antineoplastic radiation therapy: Secondary | ICD-10-CM | POA: Diagnosis not present

## 2022-05-04 LAB — RAD ONC ARIA SESSION SUMMARY
Course Elapsed Days: 20
Plan Fractions Treated to Date: 13
Plan Prescribed Dose Per Fraction: 2 Gy
Plan Total Fractions Prescribed: 40
Plan Total Prescribed Dose: 80 Gy
Reference Point Dosage Given to Date: 26 Gy
Reference Point Session Dosage Given: 2 Gy
Session Number: 13

## 2022-05-05 ENCOUNTER — Ambulatory Visit
Admission: RE | Admit: 2022-05-05 | Discharge: 2022-05-05 | Disposition: A | Discharge status: Home / Self Care | Payer: Medicare Other | Source: Ambulatory Visit | Attending: Radiation Oncology | Admitting: Radiation Oncology

## 2022-05-05 ENCOUNTER — Other Ambulatory Visit: Payer: Self-pay

## 2022-05-05 DIAGNOSIS — Z510 Encounter for antineoplastic radiation therapy: Secondary | ICD-10-CM | POA: Diagnosis not present

## 2022-05-05 LAB — RAD ONC ARIA SESSION SUMMARY
Course Elapsed Days: 21
Plan Fractions Treated to Date: 14
Plan Prescribed Dose Per Fraction: 2 Gy
Plan Total Fractions Prescribed: 40
Plan Total Prescribed Dose: 80 Gy
Reference Point Dosage Given to Date: 28 Gy
Reference Point Session Dosage Given: 2 Gy
Session Number: 14

## 2022-05-06 ENCOUNTER — Other Ambulatory Visit: Payer: Self-pay | Admitting: Internal Medicine

## 2022-05-06 ENCOUNTER — Other Ambulatory Visit: Payer: Self-pay

## 2022-05-06 ENCOUNTER — Ambulatory Visit
Admission: RE | Admit: 2022-05-06 | Discharge: 2022-05-06 | Disposition: A | Discharge status: Home / Self Care | Payer: Medicare Other | Source: Ambulatory Visit | Attending: Radiation Oncology | Admitting: Radiation Oncology

## 2022-05-06 DIAGNOSIS — Z510 Encounter for antineoplastic radiation therapy: Secondary | ICD-10-CM | POA: Diagnosis not present

## 2022-05-06 LAB — RAD ONC ARIA SESSION SUMMARY
Course Elapsed Days: 22
Plan Fractions Treated to Date: 15
Plan Prescribed Dose Per Fraction: 2 Gy
Plan Total Fractions Prescribed: 40
Plan Total Prescribed Dose: 80 Gy
Reference Point Dosage Given to Date: 30 Gy
Reference Point Session Dosage Given: 2 Gy
Session Number: 15

## 2022-05-07 ENCOUNTER — Ambulatory Visit
Admission: RE | Admit: 2022-05-07 | Discharge: 2022-05-07 | Disposition: A | Discharge status: Home / Self Care | Payer: Medicare Other | Source: Ambulatory Visit | Attending: Radiation Oncology | Admitting: Radiation Oncology

## 2022-05-07 ENCOUNTER — Other Ambulatory Visit: Payer: Self-pay

## 2022-05-07 DIAGNOSIS — Z510 Encounter for antineoplastic radiation therapy: Secondary | ICD-10-CM | POA: Diagnosis not present

## 2022-05-07 DIAGNOSIS — C61 Malignant neoplasm of prostate: Secondary | ICD-10-CM | POA: Diagnosis present

## 2022-05-07 LAB — LIPID PANEL W/O CHOL/HDL RATIO
Cholesterol, Total: 156 mg/dL (ref 100–199)
HDL: 50 mg/dL (ref >39)
LDL Chol Calc (NIH): 74 mg/dL (ref 0–99)
Triglycerides: 195 mg/dL — ABNORMAL HIGH (ref 0–149)
VLDL Cholesterol Cal: 32 mg/dL (ref 5–40)

## 2022-05-07 LAB — HGB A1C W/O EAG: Hgb A1c MFr Bld: 6.1 % — ABNORMAL HIGH (ref 4.8–5.6)

## 2022-05-07 LAB — COMPREHENSIVE METABOLIC PANEL
ALT: 57 IU/L — ABNORMAL HIGH (ref 0–44)
AST: 56 IU/L — ABNORMAL HIGH (ref 0–40)
Albumin/Globulin Ratio: 2 (ref 1.2–2.2)
Albumin: 4.7 g/dL (ref 3.8–4.8)
Alkaline Phosphatase: 76 IU/L (ref 44–121)
BUN/Creatinine Ratio: 10 (ref 10–24)
BUN: 15 mg/dL (ref 8–27)
Bilirubin Total: 0.5 mg/dL (ref 0.0–1.2)
CO2: 26 mmol/L (ref 20–29)
Calcium: 9.8 mg/dL (ref 8.6–10.2)
Chloride: 109 mmol/L — ABNORMAL HIGH (ref 96–106)
Creatinine, Ser: 1.43 mg/dL — ABNORMAL HIGH (ref 0.76–1.27)
Globulin, Total: 2.4 g/dL (ref 1.5–4.5)
Glucose: 87 mg/dL (ref 70–99)
Potassium: 4.5 mmol/L (ref 3.5–5.2)
Sodium: 136 mmol/L (ref 134–144)
Total Protein: 7.1 g/dL (ref 6.0–8.5)
eGFR: 51 mL/min/{1.73_m2} — ABNORMAL LOW (ref >59)

## 2022-05-07 LAB — RAD ONC ARIA SESSION SUMMARY
Course Elapsed Days: 23
Plan Fractions Treated to Date: 16
Plan Prescribed Dose Per Fraction: 2 Gy
Plan Total Fractions Prescribed: 40
Plan Total Prescribed Dose: 80 Gy
Reference Point Dosage Given to Date: 32 Gy
Reference Point Session Dosage Given: 2 Gy
Session Number: 16

## 2022-05-07 NOTE — Progress Notes (Signed)
Order(s) created erroneously. Erroneous order ID: [id number]  Order canceled by: Milas Hock  Order cancel date/time: 05/07/2022 3:42 PM

## 2022-05-08 ENCOUNTER — Encounter: Payer: Self-pay | Admitting: Internal Medicine

## 2022-05-08 ENCOUNTER — Ambulatory Visit
Admission: RE | Admit: 2022-05-08 | Discharge: 2022-05-08 | Disposition: A | Discharge status: Home / Self Care | Payer: Medicare Other | Source: Ambulatory Visit | Attending: Radiation Oncology | Admitting: Radiation Oncology

## 2022-05-08 ENCOUNTER — Other Ambulatory Visit: Payer: Self-pay | Admitting: Internal Medicine

## 2022-05-08 ENCOUNTER — Other Ambulatory Visit: Payer: Self-pay

## 2022-05-08 ENCOUNTER — Ambulatory Visit: Payer: Medicare Other | Admitting: Internal Medicine

## 2022-05-08 VITALS — BP 140/90 | HR 57 | Ht 73.0 in | Wt 237.2 lb

## 2022-05-08 DIAGNOSIS — E139 Other specified diabetes mellitus without complications: Secondary | ICD-10-CM

## 2022-05-08 DIAGNOSIS — E119 Type 2 diabetes mellitus without complications: Secondary | ICD-10-CM

## 2022-05-08 DIAGNOSIS — G40409 Other generalized epilepsy and epileptic syndromes, not intractable, without status epilepticus: Secondary | ICD-10-CM

## 2022-05-08 DIAGNOSIS — E1342 Other specified diabetes mellitus with diabetic polyneuropathy: Secondary | ICD-10-CM

## 2022-05-08 DIAGNOSIS — E782 Mixed hyperlipidemia: Secondary | ICD-10-CM

## 2022-05-08 DIAGNOSIS — N1831 Chronic kidney disease, stage 3a: Secondary | ICD-10-CM

## 2022-05-08 DIAGNOSIS — R600 Localized edema: Secondary | ICD-10-CM

## 2022-05-08 DIAGNOSIS — Z510 Encounter for antineoplastic radiation therapy: Secondary | ICD-10-CM | POA: Diagnosis not present

## 2022-05-08 DIAGNOSIS — I359 Nonrheumatic aortic valve disorder, unspecified: Secondary | ICD-10-CM

## 2022-05-08 DIAGNOSIS — I10 Essential (primary) hypertension: Secondary | ICD-10-CM | POA: Diagnosis not present

## 2022-05-08 LAB — GLUCOSE, POCT (MANUAL RESULT ENTRY): POC Glucose: 100 mg/dl — AB (ref 70–99)

## 2022-05-08 LAB — RAD ONC ARIA SESSION SUMMARY
Course Elapsed Days: 24
Plan Fractions Treated to Date: 17
Plan Prescribed Dose Per Fraction: 2 Gy
Plan Total Fractions Prescribed: 40
Plan Total Prescribed Dose: 80 Gy
Reference Point Dosage Given to Date: 34 Gy
Reference Point Session Dosage Given: 2 Gy
Session Number: 17

## 2022-05-08 MED ORDER — ZOLPIDEM TARTRATE 10 MG PO TABS: 10.0000 mg | ORAL_TABLET | Freq: Every evening | ORAL | 2 refills | Status: DC | PRN

## 2022-05-08 MED ORDER — LISINOPRIL 40 MG PO TABS: 40.0000 mg | ORAL_TABLET | Freq: Every morning | ORAL | 1 refills | Status: DC

## 2022-05-08 MED ORDER — LEVETIRACETAM 500 MG PO TABS: 500.0000 mg | ORAL_TABLET | Freq: Two times a day (BID) | ORAL | 1 refills | Status: DC

## 2022-05-08 MED ORDER — HYDROCHLOROTHIAZIDE 12.5 MG PO CAPS: 12.5000 mg | ORAL_CAPSULE | Freq: Every day | ORAL | 0 refills | Status: DC

## 2022-05-08 NOTE — Progress Notes (Signed)
   Subjective:    Patient ID: Ian Gibson, male    DOB: [birth year], 77 y.o.   MRN: [id number]  HPI No new complaints except elevated bp at home. Labs reviewed and notable for well controlled diabetes, LDL at target with elevated Cr on CMP review.     Review of Systems  Constitutional: Negative.   HENT: Negative.    Eyes: Negative.   Respiratory: Negative.    Cardiovascular: Negative.   Gastrointestinal: Negative.   Endocrine:       No hypoglycemic episodes  Genitourinary: Negative.   Musculoskeletal: Negative.   Neurological: Negative.        Objective:   Physical Exam Vitals and nursing note reviewed.  Constitutional:      Appearance: He is well-developed.  HENT:     Head: Normocephalic.     Mouth/Throat:     Mouth: Mucous membranes are moist.  Eyes:     Extraocular Movements: Extraocular movements intact.     Pupils: Pupils are equal, round, and reactive to light.  Neck:     Vascular: No carotid bruit.  Cardiovascular:     Rate and Rhythm: Normal rate and regular rhythm.     Chest Wall: PMI is not displaced.     Heart sounds: Normal heart sounds. No murmur heard. Pulmonary:     Breath sounds: Normal breath sounds. No rhonchi or rales.  Abdominal:     General: Bowel sounds are normal. There is no distension.     Palpations: Abdomen is soft. There is no hepatomegaly or splenomegaly.     Tenderness: There is no abdominal tenderness.  Musculoskeletal:        General: No swelling or tenderness.     Cervical back: Neck supple.     Right lower leg: 2+ Edema present.     Left lower leg: 2+ Edema present.  Skin:    General: Skin is dry.  Neurological:     General: No focal deficit present.     Mental Status: He is alert and oriented to person, place, and time.     Cranial Nerves: No cranial nerve deficit.  Psychiatric:        Mood and Affect: Mood normal.        Behavior: Behavior normal.           Assessment & Plan:  HTN-add HCTZ and fu in 1  mo Edema-SCD stockings with HCTZ

## 2022-05-11 ENCOUNTER — Other Ambulatory Visit: Payer: Self-pay

## 2022-05-11 ENCOUNTER — Ambulatory Visit
Admission: RE | Admit: 2022-05-11 | Discharge: 2022-05-11 | Disposition: A | Discharge status: Home / Self Care | Payer: Medicare Other | Source: Ambulatory Visit | Attending: Radiation Oncology | Admitting: Radiation Oncology

## 2022-05-11 DIAGNOSIS — Z510 Encounter for antineoplastic radiation therapy: Secondary | ICD-10-CM | POA: Diagnosis not present

## 2022-05-11 LAB — RAD ONC ARIA SESSION SUMMARY
Course Elapsed Days: 27
Plan Fractions Treated to Date: 18
Plan Prescribed Dose Per Fraction: 2 Gy
Plan Total Fractions Prescribed: 40
Plan Total Prescribed Dose: 80 Gy
Reference Point Dosage Given to Date: 36 Gy
Reference Point Session Dosage Given: 2 Gy
Session Number: 18

## 2022-05-12 ENCOUNTER — Other Ambulatory Visit: Payer: Self-pay

## 2022-05-12 ENCOUNTER — Ambulatory Visit
Admission: RE | Admit: 2022-05-12 | Discharge: 2022-05-12 | Disposition: A | Discharge status: Home / Self Care | Payer: Medicare Other | Source: Ambulatory Visit | Attending: Radiation Oncology | Admitting: Radiation Oncology

## 2022-05-12 DIAGNOSIS — Z510 Encounter for antineoplastic radiation therapy: Secondary | ICD-10-CM | POA: Diagnosis not present

## 2022-05-12 LAB — RAD ONC ARIA SESSION SUMMARY
Course Elapsed Days: 28
Plan Fractions Treated to Date: 19
Plan Prescribed Dose Per Fraction: 2 Gy
Plan Total Fractions Prescribed: 40
Plan Total Prescribed Dose: 80 Gy
Reference Point Dosage Given to Date: 38 Gy
Reference Point Session Dosage Given: 2 Gy
Session Number: 19

## 2022-05-13 ENCOUNTER — Ambulatory Visit
Admission: RE | Admit: 2022-05-13 | Discharge: 2022-05-13 | Disposition: A | Discharge status: Home / Self Care | Payer: Medicare Other | Source: Ambulatory Visit | Attending: Radiation Oncology | Admitting: Radiation Oncology

## 2022-05-13 ENCOUNTER — Other Ambulatory Visit: Payer: Self-pay

## 2022-05-13 ENCOUNTER — Inpatient Hospital Stay: Payer: Medicare Other

## 2022-05-13 DIAGNOSIS — C61 Malignant neoplasm of prostate: Secondary | ICD-10-CM | POA: Insufficient documentation

## 2022-05-13 DIAGNOSIS — Z510 Encounter for antineoplastic radiation therapy: Secondary | ICD-10-CM | POA: Diagnosis not present

## 2022-05-13 LAB — RAD ONC ARIA SESSION SUMMARY
Course Elapsed Days: 29
Plan Fractions Treated to Date: 20
Plan Prescribed Dose Per Fraction: 2 Gy
Plan Total Fractions Prescribed: 40
Plan Total Prescribed Dose: 80 Gy
Reference Point Dosage Given to Date: 40 Gy
Reference Point Session Dosage Given: 2 Gy
Session Number: 20

## 2022-05-13 LAB — CBC
HCT: 44.5 % (ref 39.0–52.0)
Hemoglobin: 14.9 g/dL (ref 13.0–17.0)
MCH: 31.1 pg (ref 26.0–34.0)
MCHC: 33.5 g/dL (ref 30.0–36.0)
MCV: 92.9 fL (ref 80.0–100.0)
Platelets: 219 10*3/uL (ref 150–400)
RBC: 4.79 MIL/uL (ref 4.22–5.81)
RDW: 13 % (ref 11.5–15.5)
WBC: 5 10*3/uL (ref 4.0–10.5)
nRBC: 0 % (ref 0.0–0.2)

## 2022-05-14 ENCOUNTER — Ambulatory Visit
Admission: RE | Admit: 2022-05-14 | Discharge: 2022-05-14 | Disposition: A | Discharge status: Home / Self Care | Payer: Medicare Other | Source: Ambulatory Visit | Attending: Radiation Oncology | Admitting: Radiation Oncology

## 2022-05-14 ENCOUNTER — Other Ambulatory Visit: Payer: Self-pay

## 2022-05-14 DIAGNOSIS — Z510 Encounter for antineoplastic radiation therapy: Secondary | ICD-10-CM | POA: Diagnosis not present

## 2022-05-14 LAB — RAD ONC ARIA SESSION SUMMARY
Course Elapsed Days: 30
Plan Fractions Treated to Date: 21
Plan Prescribed Dose Per Fraction: 2 Gy
Plan Total Fractions Prescribed: 40
Plan Total Prescribed Dose: 80 Gy
Reference Point Dosage Given to Date: 42 Gy
Reference Point Session Dosage Given: 2 Gy
Session Number: 21

## 2022-05-15 ENCOUNTER — Ambulatory Visit
Admission: RE | Admit: 2022-05-15 | Discharge: 2022-05-15 | Disposition: A | Discharge status: Home / Self Care | Payer: Medicare Other | Source: Ambulatory Visit | Attending: Radiation Oncology | Admitting: Radiation Oncology

## 2022-05-15 ENCOUNTER — Ambulatory Visit: Payer: Medicare Other

## 2022-05-15 ENCOUNTER — Other Ambulatory Visit: Payer: Self-pay

## 2022-05-15 DIAGNOSIS — Z510 Encounter for antineoplastic radiation therapy: Secondary | ICD-10-CM | POA: Diagnosis not present

## 2022-05-15 LAB — RAD ONC ARIA SESSION SUMMARY
Course Elapsed Days: 31
Plan Fractions Treated to Date: 22
Plan Prescribed Dose Per Fraction: 2 Gy
Plan Total Fractions Prescribed: 40
Plan Total Prescribed Dose: 80 Gy
Reference Point Dosage Given to Date: 44 Gy
Reference Point Session Dosage Given: 2 Gy
Session Number: 22

## 2022-05-18 ENCOUNTER — Other Ambulatory Visit: Payer: Self-pay

## 2022-05-18 ENCOUNTER — Ambulatory Visit: Payer: Medicare Other

## 2022-05-18 ENCOUNTER — Ambulatory Visit
Admission: RE | Admit: 2022-05-18 | Discharge: 2022-05-18 | Disposition: A | Discharge status: Home / Self Care | Payer: Medicare Other | Source: Ambulatory Visit | Attending: Radiation Oncology | Admitting: Radiation Oncology

## 2022-05-18 DIAGNOSIS — Z510 Encounter for antineoplastic radiation therapy: Secondary | ICD-10-CM | POA: Diagnosis not present

## 2022-05-18 LAB — RAD ONC ARIA SESSION SUMMARY
Course Elapsed Days: 34
Plan Fractions Treated to Date: 23
Plan Prescribed Dose Per Fraction: 2 Gy
Plan Total Fractions Prescribed: 40
Plan Total Prescribed Dose: 80 Gy
Reference Point Dosage Given to Date: 46 Gy
Reference Point Session Dosage Given: 2 Gy
Session Number: 23

## 2022-05-19 ENCOUNTER — Ambulatory Visit: Payer: Medicare Other

## 2022-05-20 ENCOUNTER — Other Ambulatory Visit: Payer: Self-pay | Admitting: *Deleted

## 2022-05-20 ENCOUNTER — Other Ambulatory Visit: Payer: Self-pay

## 2022-05-20 ENCOUNTER — Ambulatory Visit: Payer: Medicare Other

## 2022-05-20 ENCOUNTER — Other Ambulatory Visit: Payer: Self-pay | Admitting: Internal Medicine

## 2022-05-20 ENCOUNTER — Ambulatory Visit
Admission: RE | Admit: 2022-05-20 | Discharge: 2022-05-20 | Disposition: A | Discharge status: Home / Self Care | Payer: Medicare Other | Source: Ambulatory Visit | Attending: Radiation Oncology | Admitting: Radiation Oncology

## 2022-05-20 DIAGNOSIS — Z510 Encounter for antineoplastic radiation therapy: Secondary | ICD-10-CM | POA: Diagnosis not present

## 2022-05-20 LAB — RAD ONC ARIA SESSION SUMMARY
Course Elapsed Days: 36
Plan Fractions Treated to Date: 24
Plan Prescribed Dose Per Fraction: 2 Gy
Plan Total Fractions Prescribed: 40
Plan Total Prescribed Dose: 80 Gy
Reference Point Dosage Given to Date: 48 Gy
Reference Point Session Dosage Given: 2 Gy
Session Number: 24

## 2022-05-20 MED ORDER — TAMSULOSIN HCL 0.4 MG PO CAPS: 0.4000 mg | ORAL_CAPSULE | Freq: Every day | ORAL | 4 refills | Status: AC

## 2022-05-21 ENCOUNTER — Other Ambulatory Visit: Payer: Self-pay

## 2022-05-21 ENCOUNTER — Ambulatory Visit
Admission: RE | Admit: 2022-05-21 | Discharge: 2022-05-21 | Disposition: A | Discharge status: Home / Self Care | Payer: Medicare Other | Source: Ambulatory Visit | Attending: Radiation Oncology | Admitting: Radiation Oncology

## 2022-05-21 ENCOUNTER — Ambulatory Visit: Payer: Medicare Other

## 2022-05-21 DIAGNOSIS — Z510 Encounter for antineoplastic radiation therapy: Secondary | ICD-10-CM | POA: Diagnosis not present

## 2022-05-21 LAB — RAD ONC ARIA SESSION SUMMARY
Course Elapsed Days: 37
Plan Fractions Treated to Date: 25
Plan Prescribed Dose Per Fraction: 2 Gy
Plan Total Fractions Prescribed: 40
Plan Total Prescribed Dose: 80 Gy
Reference Point Dosage Given to Date: 50 Gy
Reference Point Session Dosage Given: 2 Gy
Session Number: 25

## 2022-05-22 ENCOUNTER — Ambulatory Visit: Payer: Medicare Other

## 2022-05-22 ENCOUNTER — Other Ambulatory Visit: Payer: Self-pay

## 2022-05-22 ENCOUNTER — Ambulatory Visit
Admission: RE | Admit: 2022-05-22 | Discharge: 2022-05-22 | Disposition: A | Discharge status: Home / Self Care | Payer: Medicare Other | Source: Ambulatory Visit | Attending: Radiation Oncology | Admitting: Radiation Oncology

## 2022-05-22 DIAGNOSIS — Z510 Encounter for antineoplastic radiation therapy: Secondary | ICD-10-CM | POA: Diagnosis not present

## 2022-05-22 LAB — RAD ONC ARIA SESSION SUMMARY
Course Elapsed Days: 38
Plan Fractions Treated to Date: 26
Plan Prescribed Dose Per Fraction: 2 Gy
Plan Total Fractions Prescribed: 40
Plan Total Prescribed Dose: 80 Gy
Reference Point Dosage Given to Date: 52 Gy
Reference Point Session Dosage Given: 2 Gy
Session Number: 26

## 2022-05-25 ENCOUNTER — Ambulatory Visit
Admission: RE | Admit: 2022-05-25 | Discharge: 2022-05-25 | Disposition: A | Discharge status: Home / Self Care | Payer: Medicare Other | Source: Ambulatory Visit | Attending: Radiation Oncology | Admitting: Radiation Oncology

## 2022-05-25 ENCOUNTER — Other Ambulatory Visit: Payer: Self-pay

## 2022-05-25 DIAGNOSIS — Z510 Encounter for antineoplastic radiation therapy: Secondary | ICD-10-CM | POA: Diagnosis not present

## 2022-05-25 LAB — RAD ONC ARIA SESSION SUMMARY
Course Elapsed Days: 41
Plan Fractions Treated to Date: 27
Plan Prescribed Dose Per Fraction: 2 Gy
Plan Total Fractions Prescribed: 40
Plan Total Prescribed Dose: 80 Gy
Reference Point Dosage Given to Date: 54 Gy
Reference Point Session Dosage Given: 2 Gy
Session Number: 27

## 2022-05-26 ENCOUNTER — Ambulatory Visit
Admission: RE | Admit: 2022-05-26 | Discharge: 2022-05-26 | Disposition: A | Discharge status: Home / Self Care | Payer: Medicare Other | Source: Ambulatory Visit | Attending: Radiation Oncology | Admitting: Radiation Oncology

## 2022-05-26 ENCOUNTER — Other Ambulatory Visit: Payer: Self-pay

## 2022-05-26 DIAGNOSIS — Z510 Encounter for antineoplastic radiation therapy: Secondary | ICD-10-CM | POA: Diagnosis not present

## 2022-05-26 LAB — RAD ONC ARIA SESSION SUMMARY
Course Elapsed Days: 42
Plan Fractions Treated to Date: 28
Plan Prescribed Dose Per Fraction: 2 Gy
Plan Total Fractions Prescribed: 40
Plan Total Prescribed Dose: 80 Gy
Reference Point Dosage Given to Date: 56 Gy
Reference Point Session Dosage Given: 2 Gy
Session Number: 28

## 2022-05-27 ENCOUNTER — Inpatient Hospital Stay: Payer: Medicare Other

## 2022-05-27 ENCOUNTER — Ambulatory Visit
Admission: RE | Admit: 2022-05-27 | Discharge: 2022-05-27 | Disposition: A | Discharge status: Home / Self Care | Payer: Medicare Other | Source: Ambulatory Visit | Attending: Radiation Oncology | Admitting: Radiation Oncology

## 2022-05-27 ENCOUNTER — Other Ambulatory Visit: Payer: Self-pay

## 2022-05-27 DIAGNOSIS — C61 Malignant neoplasm of prostate: Secondary | ICD-10-CM

## 2022-05-27 DIAGNOSIS — Z510 Encounter for antineoplastic radiation therapy: Secondary | ICD-10-CM | POA: Diagnosis not present

## 2022-05-27 LAB — CBC
HCT: 41.3 % (ref 39.0–52.0)
Hemoglobin: 14.1 g/dL (ref 13.0–17.0)
MCH: 31.2 pg (ref 26.0–34.0)
MCHC: 34.1 g/dL (ref 30.0–36.0)
MCV: 91.4 fL (ref 80.0–100.0)
Platelets: 204 10*3/uL (ref 150–400)
RBC: 4.52 MIL/uL (ref 4.22–5.81)
RDW: 12.9 % (ref 11.5–15.5)
WBC: 5.9 10*3/uL (ref 4.0–10.5)
nRBC: 0 % (ref 0.0–0.2)

## 2022-05-27 LAB — RAD ONC ARIA SESSION SUMMARY
Course Elapsed Days: 43
Plan Fractions Treated to Date: 29
Plan Prescribed Dose Per Fraction: 2 Gy
Plan Total Fractions Prescribed: 40
Plan Total Prescribed Dose: 80 Gy
Reference Point Dosage Given to Date: 58 Gy
Reference Point Session Dosage Given: 2 Gy
Session Number: 29

## 2022-05-28 ENCOUNTER — Ambulatory Visit
Admission: RE | Admit: 2022-05-28 | Discharge: 2022-05-28 | Disposition: A | Discharge status: Home / Self Care | Payer: Medicare Other | Source: Ambulatory Visit | Attending: Radiation Oncology | Admitting: Radiation Oncology

## 2022-05-28 ENCOUNTER — Other Ambulatory Visit: Payer: Self-pay

## 2022-05-28 DIAGNOSIS — Z510 Encounter for antineoplastic radiation therapy: Secondary | ICD-10-CM | POA: Diagnosis not present

## 2022-05-28 LAB — RAD ONC ARIA SESSION SUMMARY
Course Elapsed Days: 44
Plan Fractions Treated to Date: 30
Plan Prescribed Dose Per Fraction: 2 Gy
Plan Total Fractions Prescribed: 40
Plan Total Prescribed Dose: 80 Gy
Reference Point Dosage Given to Date: 60 Gy
Reference Point Session Dosage Given: 2 Gy
Session Number: 30

## 2022-05-29 ENCOUNTER — Other Ambulatory Visit: Payer: Self-pay

## 2022-05-29 ENCOUNTER — Ambulatory Visit
Admission: RE | Admit: 2022-05-29 | Discharge: 2022-05-29 | Disposition: A | Discharge status: Home / Self Care | Payer: Medicare Other | Source: Ambulatory Visit | Attending: Radiation Oncology | Admitting: Radiation Oncology

## 2022-05-29 DIAGNOSIS — Z510 Encounter for antineoplastic radiation therapy: Secondary | ICD-10-CM | POA: Diagnosis not present

## 2022-05-29 LAB — RAD ONC ARIA SESSION SUMMARY
Course Elapsed Days: 45
Plan Fractions Treated to Date: 31
Plan Prescribed Dose Per Fraction: 2 Gy
Plan Total Fractions Prescribed: 40
Plan Total Prescribed Dose: 80 Gy
Reference Point Dosage Given to Date: 62 Gy
Reference Point Session Dosage Given: 2 Gy
Session Number: 31

## 2022-06-01 ENCOUNTER — Ambulatory Visit
Admission: RE | Admit: 2022-06-01 | Discharge: 2022-06-01 | Disposition: A | Discharge status: Home / Self Care | Payer: Medicare Other | Source: Ambulatory Visit | Attending: Radiation Oncology | Admitting: Radiation Oncology

## 2022-06-01 ENCOUNTER — Other Ambulatory Visit: Payer: Self-pay

## 2022-06-01 DIAGNOSIS — Z510 Encounter for antineoplastic radiation therapy: Secondary | ICD-10-CM | POA: Diagnosis not present

## 2022-06-01 LAB — RAD ONC ARIA SESSION SUMMARY
Course Elapsed Days: 48
Plan Fractions Treated to Date: 32
Plan Prescribed Dose Per Fraction: 2 Gy
Plan Total Fractions Prescribed: 40
Plan Total Prescribed Dose: 80 Gy
Reference Point Dosage Given to Date: 64 Gy
Reference Point Session Dosage Given: 2 Gy
Session Number: 32

## 2022-06-02 ENCOUNTER — Ambulatory Visit
Admission: RE | Admit: 2022-06-02 | Discharge: 2022-06-02 | Disposition: A | Discharge status: Home / Self Care | Payer: Medicare Other | Source: Ambulatory Visit | Attending: Radiation Oncology | Admitting: Radiation Oncology

## 2022-06-02 ENCOUNTER — Other Ambulatory Visit: Payer: Self-pay

## 2022-06-02 DIAGNOSIS — Z510 Encounter for antineoplastic radiation therapy: Secondary | ICD-10-CM | POA: Diagnosis not present

## 2022-06-02 LAB — RAD ONC ARIA SESSION SUMMARY
Course Elapsed Days: 49
Plan Fractions Treated to Date: 33
Plan Prescribed Dose Per Fraction: 2 Gy
Plan Total Fractions Prescribed: 40
Plan Total Prescribed Dose: 80 Gy
Reference Point Dosage Given to Date: 66 Gy
Reference Point Session Dosage Given: 2 Gy
Session Number: 33

## 2022-06-03 ENCOUNTER — Other Ambulatory Visit: Payer: Self-pay

## 2022-06-03 ENCOUNTER — Ambulatory Visit
Admission: RE | Admit: 2022-06-03 | Discharge: 2022-06-03 | Disposition: A | Discharge status: Home / Self Care | Payer: Medicare Other | Source: Ambulatory Visit | Attending: Radiation Oncology | Admitting: Radiation Oncology

## 2022-06-03 DIAGNOSIS — Z510 Encounter for antineoplastic radiation therapy: Secondary | ICD-10-CM | POA: Diagnosis not present

## 2022-06-03 LAB — RAD ONC ARIA SESSION SUMMARY
Course Elapsed Days: 50
Plan Fractions Treated to Date: 34
Plan Prescribed Dose Per Fraction: 2 Gy
Plan Total Fractions Prescribed: 40
Plan Total Prescribed Dose: 80 Gy
Reference Point Dosage Given to Date: 68 Gy
Reference Point Session Dosage Given: 2 Gy
Session Number: 34

## 2022-06-04 ENCOUNTER — Ambulatory Visit
Admission: RE | Admit: 2022-06-04 | Discharge: 2022-06-04 | Disposition: A | Discharge status: Home / Self Care | Payer: Medicare Other | Source: Ambulatory Visit | Attending: Radiation Oncology | Admitting: Radiation Oncology

## 2022-06-04 ENCOUNTER — Other Ambulatory Visit: Payer: Self-pay

## 2022-06-04 DIAGNOSIS — Z510 Encounter for antineoplastic radiation therapy: Secondary | ICD-10-CM | POA: Diagnosis not present

## 2022-06-04 LAB — RAD ONC ARIA SESSION SUMMARY
Course Elapsed Days: 51
Plan Fractions Treated to Date: 35
Plan Prescribed Dose Per Fraction: 2 Gy
Plan Total Fractions Prescribed: 40
Plan Total Prescribed Dose: 80 Gy
Reference Point Dosage Given to Date: 70 Gy
Reference Point Session Dosage Given: 2 Gy
Session Number: 35

## 2022-06-05 ENCOUNTER — Ambulatory Visit
Admission: RE | Admit: 2022-06-05 | Discharge: 2022-06-05 | Disposition: A | Discharge status: Home / Self Care | Payer: Medicare Other | Source: Ambulatory Visit | Attending: Radiation Oncology | Admitting: Radiation Oncology

## 2022-06-05 ENCOUNTER — Other Ambulatory Visit: Payer: Self-pay | Admitting: Internal Medicine

## 2022-06-05 ENCOUNTER — Ambulatory Visit: Payer: Medicare Other | Admitting: Internal Medicine

## 2022-06-05 ENCOUNTER — Encounter: Payer: Self-pay | Admitting: Internal Medicine

## 2022-06-05 ENCOUNTER — Other Ambulatory Visit: Payer: Self-pay

## 2022-06-05 VITALS — BP 150/80 | HR 68 | Ht 73.0 in | Wt 231.6 lb

## 2022-06-05 DIAGNOSIS — Z510 Encounter for antineoplastic radiation therapy: Secondary | ICD-10-CM | POA: Insufficient documentation

## 2022-06-05 DIAGNOSIS — E109 Type 1 diabetes mellitus without complications: Secondary | ICD-10-CM | POA: Diagnosis not present

## 2022-06-05 DIAGNOSIS — R600 Localized edema: Secondary | ICD-10-CM

## 2022-06-05 DIAGNOSIS — C61 Malignant neoplasm of prostate: Secondary | ICD-10-CM | POA: Insufficient documentation

## 2022-06-05 LAB — RAD ONC ARIA SESSION SUMMARY
Course Elapsed Days: 52
Plan Fractions Treated to Date: 36
Plan Prescribed Dose Per Fraction: 2 Gy
Plan Total Fractions Prescribed: 40
Plan Total Prescribed Dose: 80 Gy
Reference Point Dosage Given to Date: 72 Gy
Reference Point Session Dosage Given: 2 Gy
Session Number: 36

## 2022-06-05 LAB — GLUCOSE, POCT (MANUAL RESULT ENTRY): POC Glucose: 102 mg/dl — AB (ref 70–99)

## 2022-06-05 MED ORDER — FUROSEMIDE 20 MG PO TABS: 20.0000 mg | ORAL_TABLET | Freq: Every day | ORAL | 0 refills | Status: DC

## 2022-06-05 NOTE — Progress Notes (Signed)
Established Patient Office Visit  Subjective:  Patient ID: Ian Gibson, male    DOB: [birth year]  Age: 77 y.o. MRN: [id number]  Chief Complaint  Patient presents with   Follow-up    Follow up    Here for f/u edema that'Lashandra Arauz improving.     Past Medical History:  Diagnosis Date   CKD (chronic kidney disease)    Hepatic cirrhosis due to chronic hepatitis C infection ([place]) 2015   Treated with Harvoni .  "cured"   Hypertension    Seizure ([place])    on Keppra.  No seizures in "many" years.    Social History   Socioeconomic History   Marital status: Married    Spouse name: Not on file   Number of children: Not on file   Years of education: Not on file   Highest education level: Not on file  Occupational History   Not on file  Tobacco Use   Smoking status: Never   Smokeless tobacco: Never  Vaping Use   Vaping Use: Never used  Substance and Sexual Activity   Alcohol use: Never   Drug use: Never   Sexual activity: Not on file  Other Topics Concern   Not on file  Social History Narrative   Not on file   Social Determinants of Health   Financial Resource Strain: Not on file  Food Insecurity: Not on file  Transportation Needs: Not on file  Physical Activity: Not on file  Stress: Not on file  Social Connections: Not on file  Intimate Partner Violence: Not on file    No family history on file.  No Known Allergies  Review of Systems  All other systems reviewed and are negative.      Objective:   BP (!) 150/80   Pulse 68   Ht '6\' 1"'$  (1.854 m)   Wt 231 lb 9.6 oz (105.1 kg)   SpO2 96%   BMI 30.56 kg/m   Vitals:   06/05/22 1149  BP: (!) 150/80  Pulse: 68  Height: '6\' 1"'$  (1.854 m)  Weight: 231 lb 9.6 oz (105.1 kg)  SpO2: 96%  BMI (Calculated): 30.56    Physical Exam Vitals reviewed.  Constitutional:      Appearance: Normal appearance.  HENT:     Head: Normocephalic.     Left Ear: There is no impacted cerumen.     Nose: Nose normal.      Mouth/Throat:     Mouth: Mucous membranes are moist.     Pharynx: No posterior oropharyngeal erythema.  Eyes:     Extraocular Movements: Extraocular movements intact.     Pupils: Pupils are equal, round, and reactive to light.  Cardiovascular:     Rate and Rhythm: Regular rhythm.     Chest Wall: PMI is not displaced.     Pulses: Normal pulses.     Heart sounds: Normal heart sounds. No murmur heard. Pulmonary:     Effort: Pulmonary effort is normal.     Breath sounds: Normal air entry. No rhonchi or rales.  Abdominal:     General: Abdomen is flat. Bowel sounds are normal. There is no distension.     Palpations: Abdomen is soft. There is no hepatomegaly, splenomegaly or mass.     Tenderness: There is no abdominal tenderness.  Musculoskeletal:        General: Normal range of motion.     Cervical back: Normal range of motion and neck supple.     Right lower  leg: 1+ Pitting Edema present.     Left lower leg: 1+ Pitting Edema present.  Skin:    General: Skin is warm and dry.  Neurological:     General: No focal deficit present.     Mental Status: He is alert and oriented to person, place, and time.     Cranial Nerves: No cranial nerve deficit.     Motor: No weakness.  Psychiatric:        Mood and Affect: Mood normal.        Behavior: Behavior normal.      Results for orders placed or performed in visit on 06/05/22  POCT Glucose (CBG)  Result Value Ref Range   POC Glucose 102 (A) 70 - 99 mg/dl  Results for orders placed or performed in visit on 06/05/22  Rad Onc [facility] Session Summary  Result Value Ref Range   Course ID [email or web address]    Course Intent Unknown    Course Start Date 03/31/2022  2:39 PM    Session Number 37    Course First Treatment Date 04/14/2022 11:14 AM    Course Last Treatment Date 06/05/2022  9:35 AM    Course Elapsed Days 52    Reference Point ID Prostate    Reference Point Dosage Given to Date 76 Gy   Reference Point Session Dosage Given 2 Gy   Plan ID  Prostate    Plan Fractions Treated to Date 36    Plan Total Fractions Prescribed 40    Plan Prescribed Dose Per Fraction 2 Gy   Plan Total Prescribed Dose 80.000000 Gy   Plan Primary Reference Point Prostate         Assessment & Plan:   Problem List Items Addressed This Visit       Endocrine   Diabetes mellitus ([place]) - Primary   Relevant Orders   POCT Glucose (CBG) (Completed)  1. Type 1 diabetes mellitus without complication ([facility]CC) - POCT Glucose (CBG) - Lipid panel; Future - Hemoglobin A1c; Future - Comprehensive metabolic panel; Future - CK; Future  2. Localized edema - furosemide (LASIX) 20 MG tablet; Take 1 tablet (20 mg total) by mouth daily. Take daily whenever leg edema returns for 5d at a time  Dispense: 30 tablet; Refill: 0    No follow-ups on file.   Total time spent: 20 minutes  Volanda Napoleon, MD  06/05/2022

## 2022-06-06 ENCOUNTER — Other Ambulatory Visit: Payer: Self-pay | Admitting: Internal Medicine

## 2022-06-06 DIAGNOSIS — I10 Essential (primary) hypertension: Secondary | ICD-10-CM

## 2022-06-08 ENCOUNTER — Ambulatory Visit: Payer: Medicare Other

## 2022-06-08 ENCOUNTER — Other Ambulatory Visit: Payer: Self-pay

## 2022-06-08 ENCOUNTER — Ambulatory Visit
Admission: RE | Admit: 2022-06-08 | Discharge: 2022-06-08 | Disposition: A | Discharge status: Home / Self Care | Payer: Medicare Other | Source: Ambulatory Visit | Attending: Radiation Oncology | Admitting: Radiation Oncology

## 2022-06-08 DIAGNOSIS — Z510 Encounter for antineoplastic radiation therapy: Secondary | ICD-10-CM | POA: Diagnosis not present

## 2022-06-08 LAB — RAD ONC ARIA SESSION SUMMARY
Course Elapsed Days: 55
Plan Fractions Treated to Date: 37
Plan Prescribed Dose Per Fraction: 2 Gy
Plan Total Fractions Prescribed: 40
Plan Total Prescribed Dose: 80 Gy
Reference Point Dosage Given to Date: 74 Gy
Reference Point Session Dosage Given: 2 Gy
Session Number: 37

## 2022-06-09 ENCOUNTER — Ambulatory Visit: Payer: Medicare Other

## 2022-06-09 ENCOUNTER — Other Ambulatory Visit: Payer: Self-pay

## 2022-06-09 ENCOUNTER — Ambulatory Visit
Admission: RE | Admit: 2022-06-09 | Discharge: 2022-06-09 | Disposition: A | Discharge status: Home / Self Care | Payer: Medicare Other | Source: Ambulatory Visit | Attending: Radiation Oncology | Admitting: Radiation Oncology

## 2022-06-09 DIAGNOSIS — Z510 Encounter for antineoplastic radiation therapy: Secondary | ICD-10-CM | POA: Diagnosis not present

## 2022-06-09 LAB — RAD ONC ARIA SESSION SUMMARY
Course Elapsed Days: 56
Plan Fractions Treated to Date: 38
Plan Prescribed Dose Per Fraction: 2 Gy
Plan Total Fractions Prescribed: 40
Plan Total Prescribed Dose: 80 Gy
Reference Point Dosage Given to Date: 76 Gy
Reference Point Session Dosage Given: 2 Gy
Session Number: 38

## 2022-06-10 ENCOUNTER — Ambulatory Visit: Payer: Medicare Other

## 2022-06-10 ENCOUNTER — Ambulatory Visit
Admission: RE | Admit: 2022-06-10 | Discharge: 2022-06-10 | Disposition: A | Discharge status: Home / Self Care | Payer: Medicare Other | Source: Ambulatory Visit | Attending: Radiation Oncology | Admitting: Radiation Oncology

## 2022-06-10 ENCOUNTER — Other Ambulatory Visit: Payer: Self-pay

## 2022-06-10 DIAGNOSIS — Z510 Encounter for antineoplastic radiation therapy: Secondary | ICD-10-CM | POA: Diagnosis not present

## 2022-06-10 LAB — RAD ONC ARIA SESSION SUMMARY
Course Elapsed Days: 57
Plan Fractions Treated to Date: 39
Plan Prescribed Dose Per Fraction: 2 Gy
Plan Total Fractions Prescribed: 40
Plan Total Prescribed Dose: 80 Gy
Reference Point Dosage Given to Date: 78 Gy
Reference Point Session Dosage Given: 2 Gy
Session Number: 39

## 2022-06-11 ENCOUNTER — Ambulatory Visit: Payer: Medicare Other

## 2022-06-11 ENCOUNTER — Other Ambulatory Visit: Payer: Self-pay

## 2022-06-11 ENCOUNTER — Ambulatory Visit
Admission: RE | Admit: 2022-06-11 | Discharge: 2022-06-11 | Disposition: A | Discharge status: Home / Self Care | Payer: Medicare Other | Source: Ambulatory Visit | Attending: Radiation Oncology | Admitting: Radiation Oncology

## 2022-06-11 DIAGNOSIS — Z510 Encounter for antineoplastic radiation therapy: Secondary | ICD-10-CM | POA: Diagnosis not present

## 2022-06-11 LAB — RAD ONC ARIA SESSION SUMMARY
Course Elapsed Days: 58
Plan Fractions Treated to Date: 40
Plan Prescribed Dose Per Fraction: 2 Gy
Plan Total Fractions Prescribed: 40
Plan Total Prescribed Dose: 80 Gy
Reference Point Dosage Given to Date: 80 Gy
Reference Point Session Dosage Given: 2 Gy
Session Number: 40

## 2022-07-07 ENCOUNTER — Telehealth: Payer: Self-pay | Admitting: Gastroenterology

## 2022-07-07 DIAGNOSIS — B182 Chronic viral hepatitis C: Secondary | ICD-10-CM

## 2022-07-07 NOTE — Telephone Encounter (Signed)
Patients wife called to see if he had an appointment today. He does not. She is wondering if he needs to follow up or anything. He does need a refill on his prescription of nadolol 40 MG sent to [facility] on [address]

## 2022-07-08 NOTE — Telephone Encounter (Signed)
I spoke to wife and let her know that I would verify if study/labs vs OV is needed... I let her know that I will be out of the office until Monday and she said that it will be fine for me to f/u with her then

## 2022-07-13 NOTE — Addendum Note (Signed)
Addended by: Roena Malady on: 07/13/2022 05:06 PM   Modules accepted: Orders

## 2022-07-13 NOTE — Telephone Encounter (Signed)
[place] and lab ordered... # to scheduling given to pt's spouse to schedule [place]

## 2022-07-15 ENCOUNTER — Encounter: Payer: Self-pay | Admitting: Radiation Oncology

## 2022-07-15 ENCOUNTER — Ambulatory Visit
Admission: RE | Admit: 2022-07-15 | Discharge: 2022-07-15 | Disposition: A | Discharge status: Home / Self Care | Payer: Medicare Other | Source: Ambulatory Visit | Attending: Radiation Oncology | Admitting: Radiation Oncology

## 2022-07-15 ENCOUNTER — Other Ambulatory Visit: Payer: Self-pay | Admitting: *Deleted

## 2022-07-15 VITALS — BP 158/75 | HR 61 | Temp 97.6°F | Resp 16 | Ht 73.0 in | Wt 239.6 lb

## 2022-07-15 DIAGNOSIS — C61 Malignant neoplasm of prostate: Secondary | ICD-10-CM | POA: Diagnosis present

## 2022-07-15 NOTE — Progress Notes (Signed)
Radiation Oncology Follow up Note  Name: Ian Gibson   Date:   07/15/2022 MRN:  [id number] DOB: 1945-08-16    This 77 y.o. male presents to the clinic today for 1 month follow-up status post IMRT radiation therapy for Gleason 7 (3+4) adenocarcinoma prostate presenting with a PSA in the 16 range.  REFERRING PROVIDER: Sherron Monday, MD  HPI: Patient is a 77 year old male now out 1 month having completed IMRT radiation therapy to his prostate for Gleason 7 adenocarcinoma stage IIc disease.  Seen today in routine follow-up he is doing well.  Specifically denies any increased lower urinary tract symptoms diarrhea or fatigue..  COMPLICATIONS OF TREATMENT: none  FOLLOW UP COMPLIANCE: keeps appointments   PHYSICAL EXAM:  BP (!) 158/75 Comment: BP recheck patient being followed by PCP for hypertension  Pulse 61   Temp 97.6 F (36.4 C) (Tympanic)   Resp 16   Ht 6\' 1"  (1.854 m)   Wt 239 lb 9.6 oz (108.7 kg)   BMI 31.61 kg/m  Well-developed well-nourished patient in NAD. HEENT reveals PERLA, EOMI, discs not visualized.  Oral cavity is clear. No oral mucosal lesions are identified. Neck is clear without evidence of cervical or supraclavicular adenopathy. Lungs are clear to A&P. Cardiac examination is essentially unremarkable with regular rate and rhythm without murmur rub or thrill. Abdomen is benign with no organomegaly or masses noted. Motor sensory and DTR levels are equal and symmetric in the upper and lower extremities. Cranial nerves II through XII are grossly intact. Proprioception is intact. No peripheral adenopathy or edema is identified. No motor or sensory levels are noted. Crude visual fields are within normal range.  RADIOLOGY RESULTS: No current films for review  PLAN: The present time patient is doing well very low side effect profile from his external beam radiation therapy.  And pleased with his overall progress have asked to see him back in 3 months with a PSA prior to that  visit.  Patient knows to call with any concerns.  I would like to take this opportunity to thank you for allowing me to participate in the care of your patient.Carmina Miller, MD

## 2022-07-16 LAB — AFP TUMOR MARKER: AFP, Serum, Tumor Marker: 4.7 ng/mL (ref 0.0–8.4)

## 2022-07-19 ENCOUNTER — Other Ambulatory Visit: Payer: Self-pay | Admitting: Internal Medicine

## 2022-07-24 ENCOUNTER — Ambulatory Visit
Admission: RE | Admit: 2022-07-24 | Discharge: 2022-07-24 | Disposition: A | Discharge status: Home / Self Care | Payer: Medicare Other | Source: Ambulatory Visit | Attending: Gastroenterology | Admitting: Gastroenterology

## 2022-07-24 DIAGNOSIS — B182 Chronic viral hepatitis C: Secondary | ICD-10-CM | POA: Diagnosis present

## 2022-07-24 DIAGNOSIS — K7460 Unspecified cirrhosis of liver: Secondary | ICD-10-CM | POA: Insufficient documentation

## 2022-08-05 ENCOUNTER — Ambulatory Visit: Payer: Medicare Other | Admitting: Internal Medicine

## 2022-08-05 ENCOUNTER — Encounter: Payer: Self-pay | Admitting: Internal Medicine

## 2022-08-05 VITALS — BP 142/88 | HR 59 | Ht 73.0 in | Wt 240.8 lb

## 2022-08-05 DIAGNOSIS — G40409 Other generalized epilepsy and epileptic syndromes, not intractable, without status epilepticus: Secondary | ICD-10-CM

## 2022-08-05 DIAGNOSIS — I10 Essential (primary) hypertension: Secondary | ICD-10-CM

## 2022-08-05 DIAGNOSIS — E1342 Other specified diabetes mellitus with diabetic polyneuropathy: Secondary | ICD-10-CM | POA: Diagnosis not present

## 2022-08-05 DIAGNOSIS — E119 Type 2 diabetes mellitus without complications: Secondary | ICD-10-CM

## 2022-08-05 DIAGNOSIS — E782 Mixed hyperlipidemia: Secondary | ICD-10-CM

## 2022-08-05 LAB — POCT CBG (FASTING - GLUCOSE)-MANUAL ENTRY: Glucose Fasting, POC: 109 mg/dL — AB (ref 70–99)

## 2022-08-05 MED ORDER — ZOLPIDEM TARTRATE 10 MG PO TABS: 10.0000 mg | ORAL_TABLET | Freq: Every day | ORAL | 0 refills | Status: DC

## 2022-08-05 MED ORDER — ATORVASTATIN CALCIUM 40 MG PO TABS: 40.0000 mg | ORAL_TABLET | Freq: Every evening | ORAL | 1 refills | Status: DC

## 2022-08-05 MED ORDER — GABAPENTIN 800 MG PO TABS
800.0000 mg | ORAL_TABLET | Freq: Three times a day (TID) | ORAL | 1 refills | Status: DC
Start: 2022-08-05 — End: 2022-11-04

## 2022-08-05 NOTE — Progress Notes (Signed)
Established Patient Office Visit  Subjective:  Patient ID: Ian Gibson, male    DOB: [birth year]  Age: 77 y.o. MRN: [id number]  Chief Complaint  Patient presents with   Follow-up    2 month follow up, discuss lab results.    No new complaints, here for lab review and medication refills. Edema has improved. Failed to have previsit labs done.     No other concerns at this time.   Past Medical History:  Diagnosis Date   CKD (chronic kidney disease)    Hepatic cirrhosis due to chronic hepatitis C infection (HCC) 2015   Treated with Harvoni .  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  No seizures in "many" years.    Past Surgical History:  Procedure Laterality Date   CATARACT EXTRACTION W/PHACO Right 09/16/2021   Procedure:  7.44 00:54.4;  Surgeon: Galen Manila, MD;  Location: [facility];  Service: Ophthalmology;  Laterality: Right;   COLONOSCOPY     ESOPHAGOGASTRODUODENOSCOPY      Social History   Socioeconomic History   Marital status: Married    Spouse name: Not on file   Number of children: Not on file   Years of education: Not on file   Highest education level: Not on file  Occupational History   Not on file  Tobacco Use   Smoking status: Never   Smokeless tobacco: Never  Vaping Use   Vaping Use: Never used  Substance and Sexual Activity   Alcohol use: Never   Drug use: Never   Sexual activity: Not on file  Other Topics Concern   Not on file  Social History Narrative   Not on file   Social Determinants of Health   Financial Resource Strain: Not on file  Food Insecurity: Not on file  Transportation Needs: Not on file  Physical Activity: Not on file  Stress: Not on file  Social Connections: Not on file  Intimate Partner Violence: Not on file    No family history on file.  No Known Allergies  Review of Systems  All other systems reviewed and are negative.      Objective:   BP (!) 142/88   Pulse (!) 59   Ht  6\' 1"  (1.854 m)   Wt 240 lb 12.8 oz (109.2 kg)   SpO2 97%   BMI 31.77 kg/m   Vitals:   08/05/22 0815  BP: (!) 142/88  Pulse: (!) 59  Height: 6\' 1"  (1.854 m)  Weight: 240 lb 12.8 oz (109.2 kg)  SpO2: 97%  BMI (Calculated): 31.78    Physical Exam Vitals reviewed.  Constitutional:      Appearance: Normal appearance.  HENT:     Head: Normocephalic.     Left Ear: There is no impacted cerumen.     Nose: Nose normal.     Mouth/Throat:     Mouth: Mucous membranes are moist.     Pharynx: No posterior oropharyngeal erythema.  Eyes:     Extraocular Movements: Extraocular movements intact.     Pupils: Pupils are equal, round, and reactive to light.  Cardiovascular:     Rate and Rhythm: Regular rhythm.     Chest Wall: PMI is not displaced.     Pulses: Normal pulses.     Heart sounds: Normal heart sounds. No murmur heard. Pulmonary:     Effort: Pulmonary effort is normal.     Breath sounds: Normal air entry. No rhonchi or  rales.  Abdominal:     General: Abdomen is flat. Bowel sounds are normal. There is no distension.     Palpations: Abdomen is soft. There is no hepatomegaly, splenomegaly or mass.     Tenderness: There is no abdominal tenderness.  Musculoskeletal:        General: Normal range of motion.     Cervical back: Normal range of motion and neck supple.     Right lower leg: Edema present.     Left lower leg: Edema present.  Skin:    General: Skin is warm and dry.  Neurological:     General: No focal deficit present.     Mental Status: He is alert and oriented to person, place, and time.     Cranial Nerves: No cranial nerve deficit.     Motor: No weakness.  Psychiatric:        Mood and Affect: Mood normal.        Behavior: Behavior normal.      Results for orders placed or performed in visit on 08/05/22  POCT CBG (Fasting - Glucose)  Result Value Ref Range   Glucose Fasting, POC 109 (A) 70 - 99 mg/dL        Assessment & Plan:   Problem List Items  Addressed This Visit       Endocrine   Diabetes mellitus (HCC) - Primary   Relevant Orders   POCT CBG (Fasting - Glucose) (Completed)    No follow-ups on file.   Total time spent: 30 minutes  Luna Fuse, MD  08/05/2022

## 2022-08-11 ENCOUNTER — Ambulatory Visit: Payer: Medicare Other | Admitting: Internal Medicine

## 2022-10-01 IMAGING — US US ABDOMEN LIMITED RUQ/ASCITES
1 series · 14 of 25 positions shown · non-contrast
Comparison: 02/15/2018 and prior.

CLINICAL DATA: cirrhosis

EXAM:
ULTRASOUND ABDOMEN LIMITED RIGHT UPPER QUADRANT

[Series 1: us abdomen limited ruq (liver/gb) · 14 of 49 slices shown]
[im 1/49]
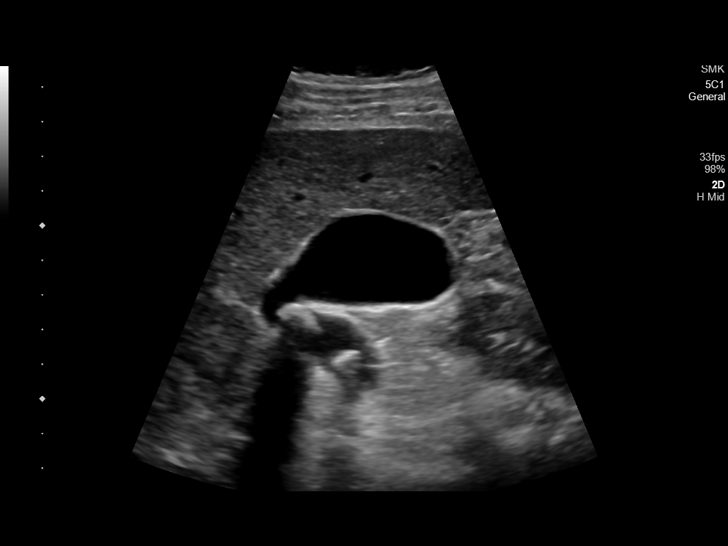
[im 5/49]
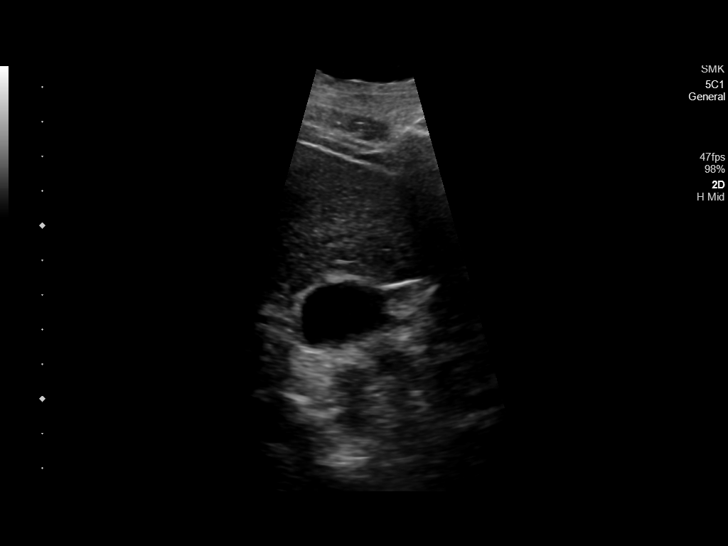
[im 9/49]
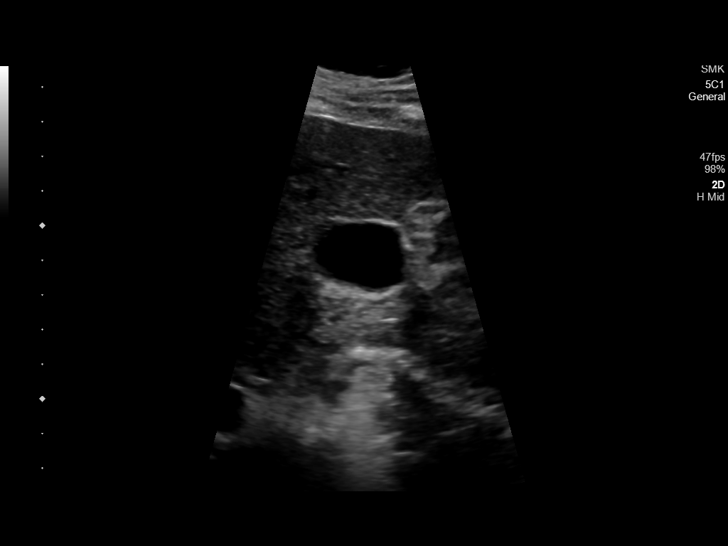
[im 13/49]
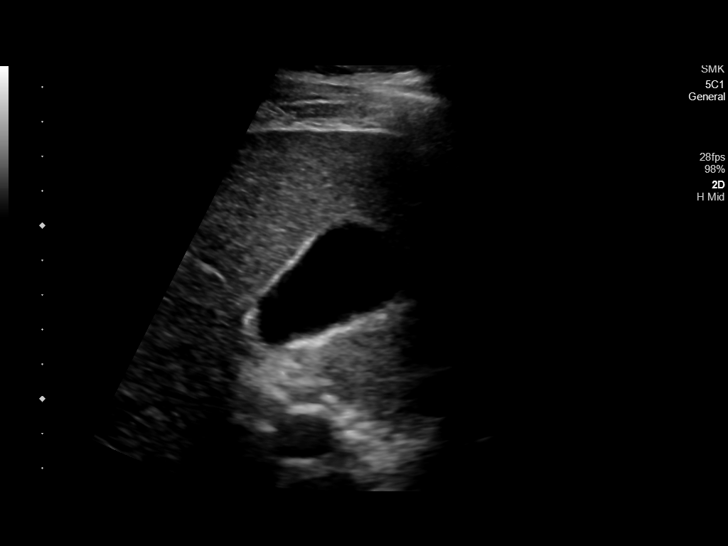
[im 17/49]
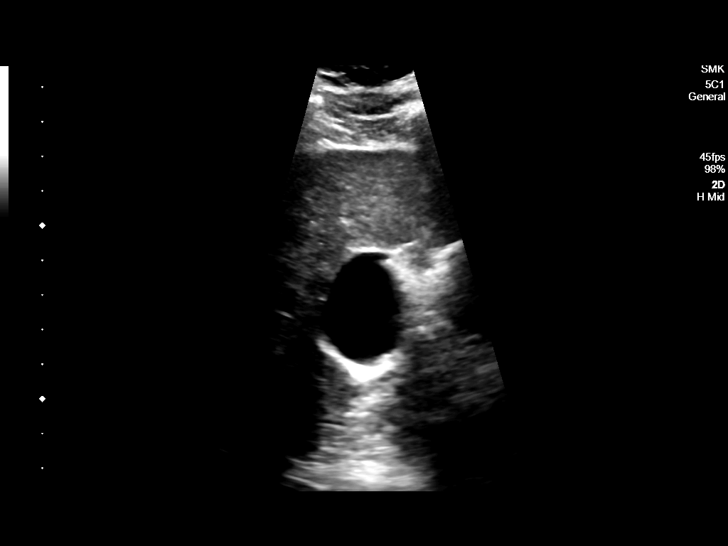
[im 19/49]
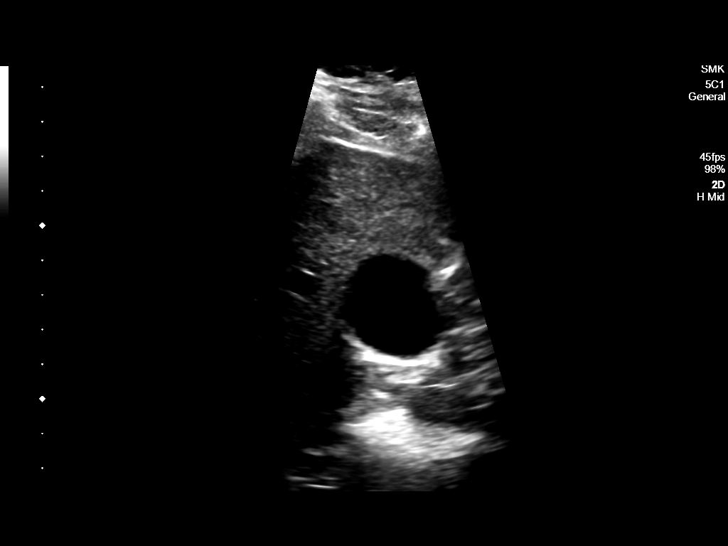
[im 23/49]
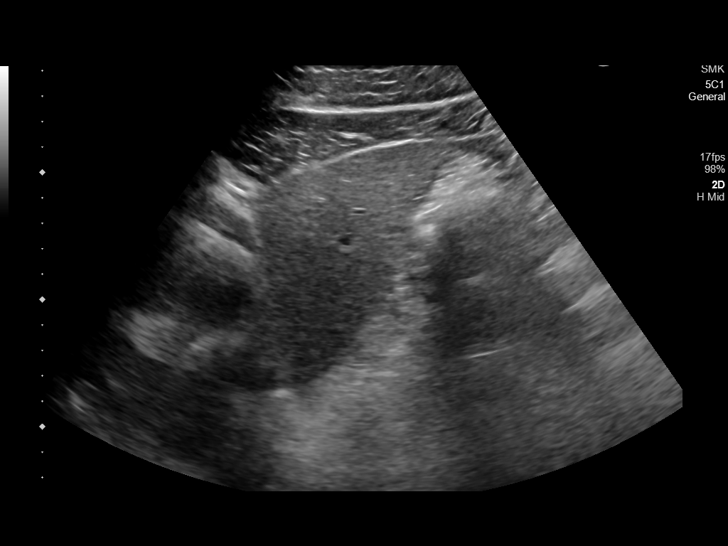
[im 27/49]
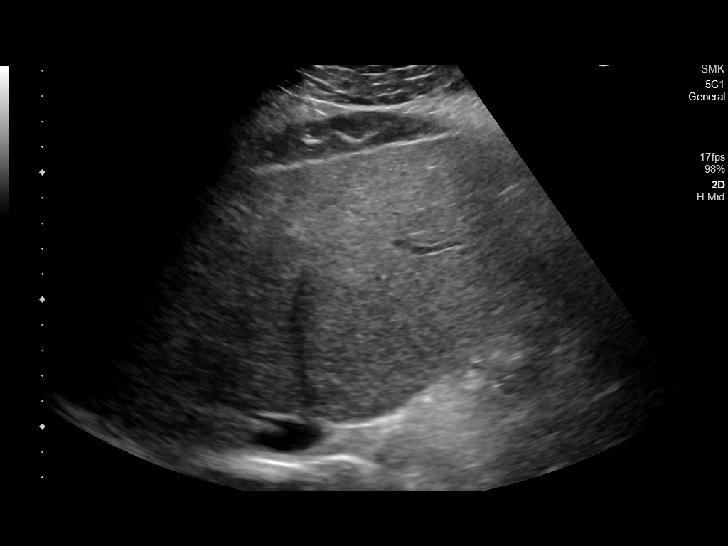
[im 31/49]
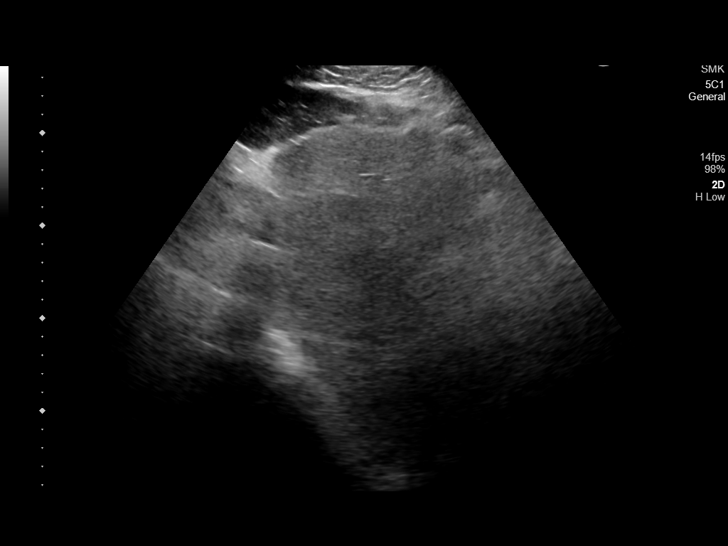
[im 33/49]
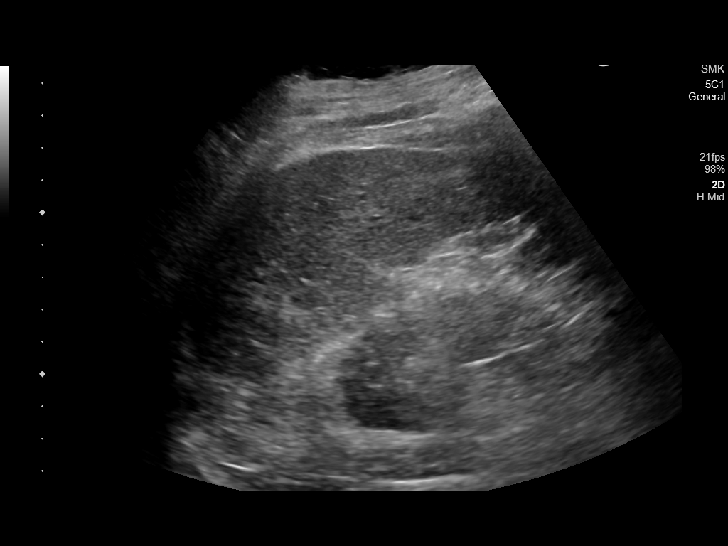
[im 37/49]
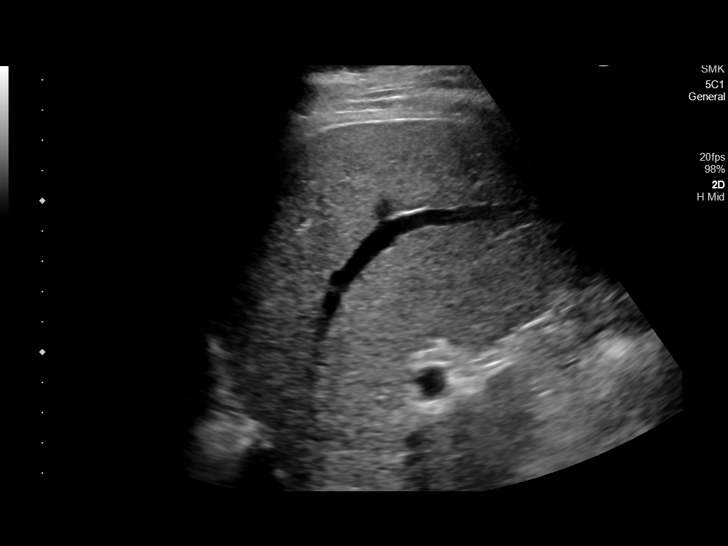
[im 41/49]
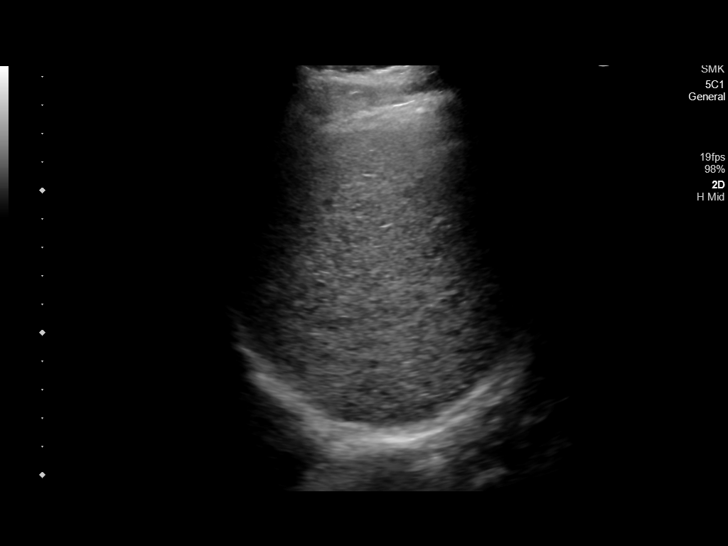
[im 45/49]
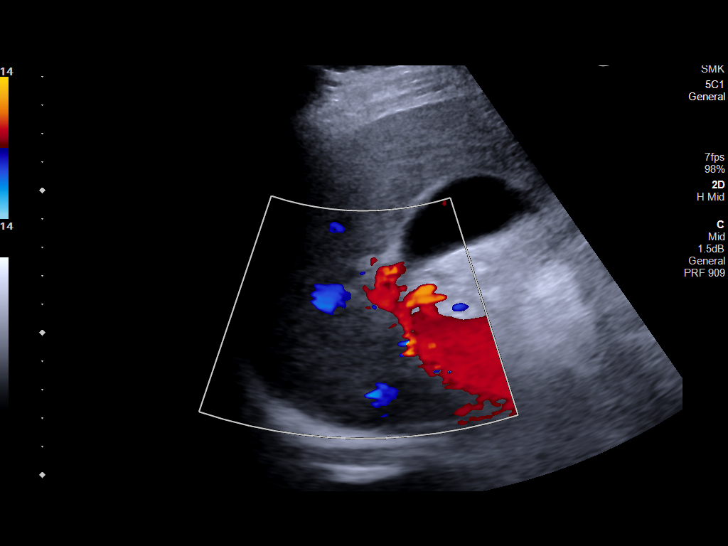
[im 49/49]
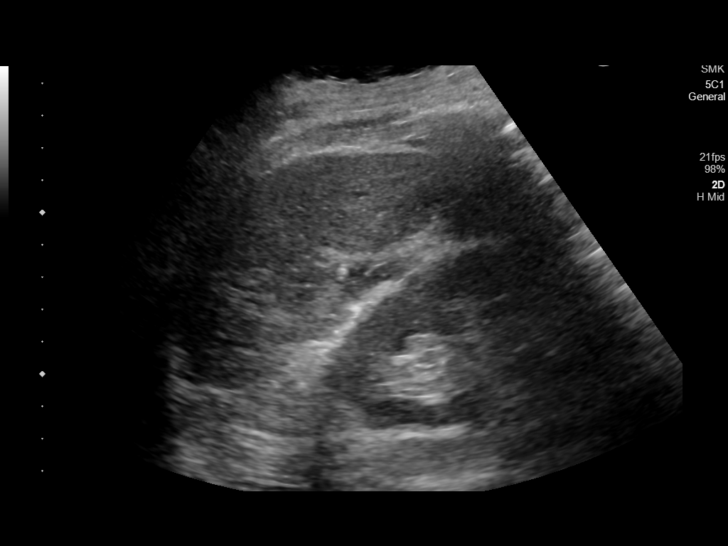

[14 of 25 positions shown; findings below may reference images not displayed]

FINDINGS: Gallbladder:

No wall thickening visualized. Intraluminal calculi measuring up to
1.4 cm. No sonographic Murphy sign noted by sonographer.

Common bile duct:

Diameter: 3.5 mm.

Liver:

No focal lesion identified. Heterogenous, echogenic parenchyma with
nodular contour. Portal vein is patent on color Doppler imaging with
normal direction of blood flow towards the liver.

Other: None.
IMPRESSION: Cirrhotic liver morphology.  No focal hepatic lesion.

Cholelithiasis without cholecystitis.

## 2022-10-07 ENCOUNTER — Inpatient Hospital Stay: Payer: Medicare Other | Attending: Radiation Oncology

## 2022-10-07 DIAGNOSIS — C61 Malignant neoplasm of prostate: Secondary | ICD-10-CM | POA: Diagnosis present

## 2022-10-07 LAB — PSA: Prostatic Specific Antigen: 0.01 ng/mL (ref 0.00–4.00)

## 2022-10-14 ENCOUNTER — Encounter: Payer: Self-pay | Admitting: Radiation Oncology

## 2022-10-14 ENCOUNTER — Ambulatory Visit
Admission: RE | Admit: 2022-10-14 | Discharge: 2022-10-14 | Disposition: A | Discharge status: Home / Self Care | Payer: Medicare Other | Source: Ambulatory Visit | Attending: Radiation Oncology | Admitting: Radiation Oncology

## 2022-10-14 VITALS — BP 120/78 | HR 66 | Temp 96.4°F | Ht 73.0 in | Wt 241.9 lb

## 2022-10-14 DIAGNOSIS — C61 Malignant neoplasm of prostate: Secondary | ICD-10-CM | POA: Insufficient documentation

## 2022-10-14 NOTE — Progress Notes (Signed)
Radiation Oncology Follow up Note  Name: Ian Gibson   Date:   10/14/2022 MRN:  [id number] DOB: December 28, 1945    This 77 y.o. male presents to the clinic today for   68-month follow-up status post.  Image guided IMRT radiation therapy for Gleason 7 (3+4) adenocarcinoma presenting with a PSA in the 16 range  REFERRING PROVIDER: Sherron Monday, MD  HPI: Patient is a 77 year old male now out for months having completed IMRT radiation therapy for Gleason 7 adenocarcinoma.  He is seen today in routine follow-up doing well specifically denies any increased lower urinary tract symptoms diarrhea or fatigue.  His PSA is now less than 0.01.  COMPLICATIONS OF TREATMENT: none  FOLLOW UP COMPLIANCE: keeps appointments   PHYSICAL EXAM:  BP 120/78   Pulse 66   Temp (!) 96.4 F (35.8 C)   Ht 6\' 1"  (1.854 m)   Wt 241 lb 14.4 oz (109.7 kg)   BMI 31.91 kg/m  Well-developed well-nourished patient in NAD. HEENT reveals PERLA, EOMI, discs not visualized.  Oral cavity is clear. No oral mucosal lesions are identified. Neck is clear without evidence of cervical or supraclavicular adenopathy. Lungs are clear to A&P. Cardiac examination is essentially unremarkable with regular rate and rhythm without murmur rub or thrill. Abdomen is benign with no organomegaly or masses noted. Motor sensory and DTR levels are equal and symmetric in the upper and lower extremities. Cranial nerves II through XII are grossly intact. Proprioception is intact. No peripheral adenopathy or edema is identified. No motor or sensory levels are noted. Crude visual fields are within normal range.  RADIOLOGY RESULTS: No current films for review  PLAN: Present time patient is doing well under excellent biochemical control of his prostate cancer.  And pleased with his overall progress.  I have asked to see him back in 6 months with a follow-up PSA.  Patient knows to call with any concerns at any time.  I would like to take this opportunity  to thank you for allowing me to participate in the care of your patient.Carmina Miller, MD

## 2022-11-04 ENCOUNTER — Other Ambulatory Visit: Payer: Self-pay | Admitting: Internal Medicine

## 2022-11-04 ENCOUNTER — Other Ambulatory Visit: Payer: Medicare Other

## 2022-11-04 ENCOUNTER — Other Ambulatory Visit: Payer: Self-pay

## 2022-11-04 DIAGNOSIS — G40409 Other generalized epilepsy and epileptic syndromes, not intractable, without status epilepticus: Secondary | ICD-10-CM

## 2022-11-04 DIAGNOSIS — E782 Mixed hyperlipidemia: Secondary | ICD-10-CM

## 2022-11-04 DIAGNOSIS — E109 Type 1 diabetes mellitus without complications: Secondary | ICD-10-CM

## 2022-11-04 DIAGNOSIS — I10 Essential (primary) hypertension: Secondary | ICD-10-CM

## 2022-11-04 DIAGNOSIS — E119 Type 2 diabetes mellitus without complications: Secondary | ICD-10-CM

## 2022-11-04 DIAGNOSIS — R600 Localized edema: Secondary | ICD-10-CM

## 2022-11-04 DIAGNOSIS — E1342 Other specified diabetes mellitus with diabetic polyneuropathy: Secondary | ICD-10-CM

## 2022-11-04 MED ORDER — ATORVASTATIN CALCIUM 40 MG PO TABS
40.0000 mg | ORAL_TABLET | Freq: Every evening | ORAL | 1 refills | Status: DC
Start: 2022-11-04 — End: 2023-05-07

## 2022-11-04 MED ORDER — FUROSEMIDE 20 MG PO TABS
20.0000 mg | ORAL_TABLET | Freq: Every day | ORAL | 0 refills | Status: DC
Start: 2022-11-04 — End: 2023-05-07

## 2022-11-04 MED ORDER — GABAPENTIN 800 MG PO TABS
800.0000 mg | ORAL_TABLET | Freq: Three times a day (TID) | ORAL | 1 refills | Status: DC
Start: 2022-11-04 — End: 2023-05-07

## 2022-11-04 MED ORDER — LISINOPRIL 40 MG PO TABS: 40.0000 mg | ORAL_TABLET | Freq: Every morning | ORAL | 1 refills | Status: DC

## 2022-11-04 MED ORDER — LEVETIRACETAM 500 MG PO TABS
500.0000 mg | ORAL_TABLET | Freq: Two times a day (BID) | ORAL | 1 refills | Status: DC
Start: 2022-11-04 — End: 2023-05-07

## 2022-11-04 MED ORDER — HYDROCHLOROTHIAZIDE 12.5 MG PO CAPS
12.5000 mg | ORAL_CAPSULE | Freq: Every day | ORAL | 1 refills | Status: DC
Start: 2022-11-04 — End: 2023-05-07

## 2022-11-04 MED ORDER — CETIRIZINE HCL 10 MG PO TABS: 10.0000 mg | ORAL_TABLET | Freq: Every morning | ORAL | 1 refills | Status: DC

## 2022-11-04 MED ORDER — ZOLPIDEM TARTRATE 10 MG PO TABS
10.0000 mg | ORAL_TABLET | Freq: Every day | ORAL | 0 refills | Status: DC
Start: 2022-11-04 — End: 2023-02-03

## 2022-11-11 ENCOUNTER — Encounter: Payer: Self-pay | Admitting: Internal Medicine

## 2022-11-11 ENCOUNTER — Ambulatory Visit (INDEPENDENT_AMBULATORY_CARE_PROVIDER_SITE_OTHER): Payer: Medicare Other | Admitting: Internal Medicine

## 2022-11-11 VITALS — BP 150/86 | HR 65 | Ht 73.0 in | Wt 237.0 lb

## 2022-11-11 DIAGNOSIS — N1831 Chronic kidney disease, stage 3a: Secondary | ICD-10-CM

## 2022-11-11 DIAGNOSIS — E119 Type 2 diabetes mellitus without complications: Secondary | ICD-10-CM | POA: Diagnosis not present

## 2022-11-11 DIAGNOSIS — I10 Essential (primary) hypertension: Secondary | ICD-10-CM

## 2022-11-11 DIAGNOSIS — E782 Mixed hyperlipidemia: Secondary | ICD-10-CM

## 2022-11-11 DIAGNOSIS — Z1331 Encounter for screening for depression: Secondary | ICD-10-CM

## 2022-11-11 LAB — POCT CBG (FASTING - GLUCOSE)-MANUAL ENTRY: Glucose Fasting, POC: 124 mg/dL — AB (ref 70–99)

## 2022-11-11 NOTE — Progress Notes (Signed)
Established Patient Office Visit  Subjective:  Patient ID: Ian Gibson, male    DOB: 05-Dec-1945  Age: 77 y.o. MRN: [id number]  Chief Complaint  Patient presents with   Annual Exam    AWV, discuss lab results.    No new complaints, here for AWV refer to quality metrics and scanned documents.  Labs reviewed and notable for well controlled lipids, stable gfr but hypernatremia and normalization of LFTs. A1c has improved to 5.7.    No other concerns at this time.   Past Medical History:  Diagnosis Date   CKD (chronic kidney disease)    Hepatic cirrhosis due to chronic hepatitis C infection (HCC) 2015   Treated with Harvoni .  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  No seizures in "many" years.    Past Surgical History:  Procedure Laterality Date   CATARACT EXTRACTION W/PHACO Right 09/16/2021   Procedure:  7.44 00:54.4;  Surgeon: Galen Manila, MD;  Location: [facility];  Service: Ophthalmology;  Laterality: Right;   COLONOSCOPY     ESOPHAGOGASTRODUODENOSCOPY      Social History   Socioeconomic History   Marital status: Married    Spouse name: Not on file   Number of children: Not on file   Years of education: Not on file   Highest education level: Not on file  Occupational History   Not on file  Tobacco Use   Smoking status: Never   Smokeless tobacco: Never  Vaping Use   Vaping status: Never Used  Substance and Sexual Activity   Alcohol use: Never   Drug use: Never   Sexual activity: Not on file  Other Topics Concern   Not on file  Social History Narrative   Not on file   Social Determinants of Health   Financial Resource Strain: Not on file  Food Insecurity: Not on file  Transportation Needs: Not on file  Physical Activity: Not on file  Stress: Not on file  Social Connections: Not on file  Intimate Partner Violence: Not on file    No family history on file.  No Known Allergies  Review of Systems  Constitutional:   Positive for weight loss.  HENT: Negative.    Eyes: Negative.   Respiratory: Negative.    Cardiovascular: Negative.   Gastrointestinal: Negative.   Genitourinary: Negative.  Negative for frequency and urgency.  Musculoskeletal:  Positive for back pain.  Skin: Negative.   Neurological: Negative.  Negative for seizures.  Endo/Heme/Allergies: Negative.   Psychiatric/Behavioral: Negative.         Objective:   BP (!) 150/86   Pulse 65   Ht 6\' 1"  (1.854 m)   Wt 237 lb (107.5 kg)   SpO2 95%   BMI 31.27 kg/m   Vitals:   11/11/22 0824  BP: (!) 150/86  Pulse: 65  Height: 6\' 1"  (1.854 m)  Weight: 237 lb (107.5 kg)  SpO2: 95%  BMI (Calculated): 31.27    Physical Exam Vitals reviewed.  Constitutional:      Appearance: Normal appearance.  HENT:     Head: Normocephalic.     Left Ear: There is no impacted cerumen.     Nose: Nose normal.     Mouth/Throat:     Mouth: Mucous membranes are moist.     Pharynx: No posterior oropharyngeal erythema.  Eyes:     Extraocular Movements: Extraocular movements intact.     Pupils: Pupils are equal, round, and **Note De-Identified via Obfucation** reactive to light.  Cardiovacular:     Rate and Rhythm: Regular rhythm.     Chet Wall: PMI i not diplaced.     Pule: Normal pule.     Heart ound: Normal heart ound. No murmur heard. Pulmonary:     Effort: Pulmonary effort i normal.     Breath ound: Normal air entry. No rhonchi or rale.  Abdominal:     General: Abdomen i flat. Bowel ound are normal. There i no ditenion.     Palpation: Abdomen i oft. There i no hepatomegaly, plenomegaly or ma.     Tenderne: There i no abdominal tenderne.  Muculokeletal:        General: Normal range of motion.     Cervical back: Normal range of motion and neck upple.     Right lower leg: Edema preent.     Left lower leg: Edema preent.  Skin:    General: Skin i warm and dry.  Neurological:     General: No focal deficit preent.     Mental Statu: He i  alert and oriented to peron, place, and time.     Cranial Nerve: No cranial nerve deficit.     Motor: No weakne.  Pychiatric:        Mood and Affect: Mood normal.        Behavior: Behavior normal.      Reult for order placed or performed in viit on 11/11/22  POCT CBG (Fating - Glucoe)  Reult Value Ref Range   Glucoe Fating, POC 124 (A) 70 - 99 mg/dL    Recent Reult (from the pat 2160 hour())  PSA     Statu: None   Collection Time: 10/07/22  8:44 AM  Reult Value Ref Range   Protatic Specific Antigen 0.01 0.00 - 4.00 ng/mL    Comment: (NOTE) While PSA level of <=4.00 ng/ml are reported a reference range, ome men with level below 4.00 ng/ml can have protate cancer and many men with PSA above 4.00 ng/ml do not have protate cancer.  Other tet uch a free PSA, age pecific reference range, PSA velocity and PSA doubling time may be helpful epecially in men le than 75 year old. Performed at [facility], [address] [place], [place] [address]   CK     Statu: Abnormal   Collection Time: 11/04/22  9:29 AM  Reult Value Ref Range   Total CK 360 (H) 41 - 331 U/L  Comprehenive metabolic panel     Statu: Abnormal   Collection Time: 11/04/22  9:29 AM  Reult Value Ref Range   Glucoe 90 70 - 99 mg/dL   BUN 13 8 - 27 mg/dL   Creatinine, Ser 0.86 (H) 0.76 - 1.27 mg/dL   eGFR 52 (L) >57 QI/ONG/2.95   BUN/Creatinine Ratio 9 (L) 10 - 24   Sodium 145 (H) 134 - 144 mmol/L   Potaium 4.8 3.5 - 5.2 mmol/L   Chloride 104 96 - 106 mmol/L   CO2 23 20 - 29 mmol/L   Calcium 10.0 8.6 - 10.2 mg/dL   Total Protein 7.2 6.0 - 8.5 g/dL   Albumin 4.6 3.8 - 4.8 g/dL   Globulin, Total 2.6 1.5 - 4.5 g/dL   Bilirubin Total 0.5 0.0 - 1.2 mg/dL   Alkaline Phophatae 76 44 - 121 IU/L   AST 40 0 - 40 IU/L   ALT 38 0 - 44 IU/L  Hemoglobin A1c     Statu: Abnormal  Collection Time: 11/04/22  9:29 AM  Result Value Ref Range   Hgb A1c MFr Bld 5.7 (H) 4.8 -  5.6 %    Comment:          Prediabetes: 5.7 - 6.4          Diabetes: >6.4          Glycemic control for adults with diabetes: <7.0    Est. average glucose Bld gHb Est-mCnc 117 mg/dL  Lipid panel     Status: Abnormal   Collection Time: 11/04/22  9:29 AM  Result Value Ref Range   Cholesterol, Total 172 100 - 199 mg/dL   Triglycerides 161 (H) 0 - 149 mg/dL   HDL 53 >09 mg/dL   VLDL Cholesterol Cal 29 5 - 40 mg/dL   LDL Chol Calc (NIH) 90 0 - 99 mg/dL   Chol/HDL Ratio 3.2 0.0 - 5.0 ratio    Comment:                                   T. Chol/HDL Ratio                                             Men  Women                               1/2 Avg.Risk  3.4    3.3                                   Avg.Risk  5.0    4.4                                2X Avg.Risk  9.6    7.1                                3X Avg.Risk 23.4   11.0   POCT CBG (Fasting - Glucose)     Status: Abnormal   Collection Time: 11/11/22  8:32 AM  Result Value Ref Range   Glucose Fasting, POC 124 (A) 70 - 99 mg/dL      Assessment & Plan:  As per problem list  Problem List Items Addressed This Visit       Cardiovascular and Mediastinum   Hypertension     Endocrine   Diabetes mellitus (HCC) - Primary   Relevant Orders   POCT CBG (Fasting - Glucose) (Completed)   Hemoglobin A1c   Lipid panel     Genitourinary   Stage 3a chronic kidney disease (HCC)   Relevant Orders   Comprehensive metabolic panel     Other   Hyperlipidemia, unspecified   Relevant Orders   Lipid panel   Comprehensive metabolic panel    Return in about 3 months (around 02/11/2023) for fu with labs prior.   Total time spent: 45 minutes  Luna Fuse, MD  11/11/2022   This document may have been prepared by [facility] Voice Recognition software and as such may include unintentional dictation errors.

## 2023-01-05 ENCOUNTER — Encounter: Payer: Self-pay | Admitting: Internal Medicine

## 2023-01-18 ENCOUNTER — Telehealth: Payer: Self-pay | Admitting: Gastroenterology

## 2023-01-18 MED ORDER — NADOLOL 40 MG PO TABS: 40.0000 mg | ORAL_TABLET | Freq: Every day | ORAL | 0 refills | Status: DC

## 2023-01-18 NOTE — Telephone Encounter (Signed)
The patient wife Quenten Raven) called in to change her husband pharmacy. [facility] on [address] [employer] [place] [facility] [place], [place], [place] [address]. The medication that needs to be filled is nadolol 40 MG.

## 2023-01-18 NOTE — Addendum Note (Signed)
Addended by: Roena Malady on: 01/18/2023 02:18 PM   Modules accepted: Orders

## 2023-01-20 ENCOUNTER — Other Ambulatory Visit: Payer: Self-pay | Admitting: Gastroenterology

## 2023-01-21 ENCOUNTER — Ambulatory Visit: Payer: Medicare Other | Admitting: Gastroenterology

## 2023-02-03 ENCOUNTER — Other Ambulatory Visit: Payer: Self-pay | Admitting: Internal Medicine

## 2023-02-03 ENCOUNTER — Other Ambulatory Visit: Payer: Self-pay

## 2023-02-03 ENCOUNTER — Other Ambulatory Visit: Payer: Medicare Other

## 2023-02-03 DIAGNOSIS — C61 Malignant neoplasm of prostate: Secondary | ICD-10-CM

## 2023-02-03 DIAGNOSIS — E119 Type 2 diabetes mellitus without complications: Secondary | ICD-10-CM

## 2023-02-03 DIAGNOSIS — G40409 Other generalized epilepsy and epileptic syndromes, not intractable, without status epilepticus: Secondary | ICD-10-CM

## 2023-02-03 DIAGNOSIS — N1831 Chronic kidney disease, stage 3a: Secondary | ICD-10-CM

## 2023-02-03 DIAGNOSIS — E782 Mixed hyperlipidemia: Secondary | ICD-10-CM

## 2023-02-03 LAB — CBC WITH DIFF/PLATELET
Basophils Absolute: 0.1 10*3/uL (ref 0.0–0.2)
Basos: 2 %
EOS (ABSOLUTE): 0.2 10*3/uL (ref 0.0–0.4)
Eos: 4 %
Hematocrit: 39.1 % (ref 37.5–51.0)
Hemoglobin: 12.3 g/dL — ABNORMAL LOW (ref 13.0–17.7)
Immature Grans (Abs): 0 10*3/uL (ref 0.0–0.1)
Immature Granulocytes: 0 %
Lymphocytes Absolute: 1.2 10*3/uL (ref 0.7–3.1)
Lymphs: 22 %
MCH: 30.9 pg (ref 26.6–33.0)
MCHC: 31.5 g/dL (ref 31.5–35.7)
MCV: 98 fL — ABNORMAL HIGH (ref 79–97)
Monocytes Absolute: 0.6 10*3/uL (ref 0.1–0.9)
Monocytes: 12 %
Neutrophils Absolute: 3.4 10*3/uL (ref 1.4–7.0)
Neutrophils: 60 %
Platelets: 216 10*3/uL (ref 150–450)
RBC: 3.98 x10E6/uL — ABNORMAL LOW (ref 4.14–5.80)
RDW: 12.9 % (ref 11.6–15.4)
WBC: 5.5 10*3/uL (ref 3.4–10.8)

## 2023-02-03 MED ORDER — ZOLPIDEM TARTRATE 10 MG PO TABS: 10.0000 mg | ORAL_TABLET | Freq: Every day | ORAL | 0 refills | Status: DC

## 2023-02-04 LAB — HEMOGLOBIN A1C
Est. average glucose Bld gHb Est-mCnc: 134 mg/dL
Hgb A1c MFr Bld: 6.3 % — ABNORMAL HIGH (ref 4.8–5.6)

## 2023-02-04 LAB — COMPREHENSIVE METABOLIC PANEL
ALT: 24 [IU]/L (ref 0–44)
AST: 27 [IU]/L (ref 0–40)
Albumin: 4.6 g/dL (ref 3.8–4.8)
Alkaline Phosphatase: 82 [IU]/L (ref 44–121)
BUN/Creatinine Ratio: 10 (ref 10–24)
BUN: 15 mg/dL (ref 8–27)
Bilirubin Total: 0.5 mg/dL (ref 0.0–1.2)
CO2: 26 mmol/L (ref 20–29)
Calcium: 9.7 mg/dL (ref 8.6–10.2)
Chloride: 103 mmol/L (ref 96–106)
Creatinine, Ser: 1.49 mg/dL — ABNORMAL HIGH (ref 0.76–1.27)
Globulin, Total: 2.2 g/dL (ref 1.5–4.5)
Glucose: 100 mg/dL — ABNORMAL HIGH (ref 70–99)
Potassium: 4.9 mmol/L (ref 3.5–5.2)
Sodium: 144 mmol/L (ref 134–144)
Total Protein: 6.8 g/dL (ref 6.0–8.5)
eGFR: 49 mL/min/{1.73_m2} — ABNORMAL LOW (ref >59)

## 2023-02-04 LAB — LIPID PANEL
Chol/HDL Ratio: 3.9 ratio (ref 0.0–5.0)
Cholesterol, Total: 181 mg/dL (ref 100–199)
HDL: 47 mg/dL (ref >39)
LDL Chol Calc (NIH): 106 mg/dL — ABNORMAL HIGH (ref 0–99)
Triglycerides: 157 mg/dL — ABNORMAL HIGH (ref 0–149)
VLDL Cholesterol Cal: 28 mg/dL (ref 5–40)

## 2023-02-10 ENCOUNTER — Ambulatory Visit (INDEPENDENT_AMBULATORY_CARE_PROVIDER_SITE_OTHER): Payer: Medicare Other | Admitting: Internal Medicine

## 2023-02-10 ENCOUNTER — Encounter: Payer: Self-pay | Admitting: Internal Medicine

## 2023-02-10 VITALS — BP 136/76 | HR 68 | Ht 73.0 in | Wt 246.4 lb

## 2023-02-10 DIAGNOSIS — E1342 Other specified diabetes mellitus with diabetic polyneuropathy: Secondary | ICD-10-CM

## 2023-02-10 DIAGNOSIS — Z23 Encounter for immunization: Secondary | ICD-10-CM | POA: Diagnosis not present

## 2023-02-10 DIAGNOSIS — N1831 Chronic kidney disease, stage 3a: Secondary | ICD-10-CM

## 2023-02-10 DIAGNOSIS — E782 Mixed hyperlipidemia: Secondary | ICD-10-CM | POA: Diagnosis not present

## 2023-02-10 DIAGNOSIS — Z0130 Encounter for examination of blood pressure without abnormal findings: Secondary | ICD-10-CM

## 2023-02-10 DIAGNOSIS — E118 Type 2 diabetes mellitus with unspecified complications: Secondary | ICD-10-CM

## 2023-02-10 LAB — POC CREATINE & ALBUMIN,URINE
Creatinine, POC: 50 mg/dL
Microalbumin Ur, POC: 30 mg/L

## 2023-02-10 MED ORDER — EMPAGLIFLOZIN 10 MG PO TABS: 10.0000 mg | ORAL_TABLET | Freq: Every day | ORAL | 0 refills | Status: DC

## 2023-02-10 NOTE — Progress Notes (Signed)
Established Patient Office Visit  Subjective:  Patient ID: Ian Gibson, male    DOB: 1946/02/17  Age: 77 y.o. MRN: [id number]  Chief Complaint  Patient presents with   Follow-up    3 month follow up, discuss lab results.    No new complaints, here for lab review and medication refills. Labs reviewed and notable for uncontrolled LDL and deterioration in A1c although still controlled.   No other concerns at this time.   Past Medical History:  Diagnosis Date   CKD (chronic kidney disease)    Hepatic cirrhosis due to chronic hepatitis C infection (HCC) 2015   Treated with Harvoni .  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  No seizures in "many" years.    Past Surgical History:  Procedure Laterality Date   CATARACT EXTRACTION W/PHACO Right 09/16/2021   Procedure:  7.44 00:54.4;  Surgeon: Galen Manila, MD;  Location: [facility];  Service: Ophthalmology;  Laterality: Right;   COLONOSCOPY     ESOPHAGOGASTRODUODENOSCOPY      Social History   Socioeconomic History   Marital status: Married    Spouse name: Not on file   Number of children: Not on file   Years of education: Not on file   Highest education level: Not on file  Occupational History   Not on file  Tobacco Use   Smoking status: Never   Smokeless tobacco: Never  Vaping Use   Vaping status: Never Used  Substance and Sexual Activity   Alcohol use: Never   Drug use: Never   Sexual activity: Not on file  Other Topics Concern   Not on file  Social History Narrative   Not on file   Social Determinants of Health   Financial Resource Strain: Not on file  Food Insecurity: Not on file  Transportation Needs: Not on file  Physical Activity: Not on file  Stress: Not on file  Social Connections: Not on file  Intimate Partner Violence: Not on file    No family history on file.  No Known Allergies  Review of Systems  Constitutional:  Negative for weight loss (gained 19 lbs).   HENT: Negative.    Eyes: Negative.   Respiratory: Negative.    Cardiovascular: Negative.   Gastrointestinal: Negative.   Genitourinary: Negative.  Negative for frequency and urgency.  Musculoskeletal:  Positive for back pain.  Skin: Negative.   Neurological: Negative.  Negative for seizures.  Endo/Heme/Allergies: Negative.   Psychiatric/Behavioral: Negative.         Objective:   BP 136/76   Pulse 68   Ht 6\' 1"  (1.854 m)   Wt 246 lb 6.4 oz (111.8 kg)   SpO2 98%   BMI 32.51 kg/m   Vitals:   02/10/23 0839  BP: 136/76  Pulse: 68  Height: 6\' 1"  (1.854 m)  Weight: 246 lb 6.4 oz (111.8 kg)  SpO2: 98%  BMI (Calculated): 32.52    Physical Exam Vitals reviewed.  Constitutional:      Appearance: Normal appearance.  HENT:     Head: Normocephalic.     Left Ear: There is no impacted cerumen.     Nose: Nose normal.     Mouth/Throat:     Mouth: Mucous membranes are moist.     Pharynx: No posterior oropharyngeal erythema.  Eyes:     Extraocular Movements: Extraocular movements intact.     Pupils: Pupils are equal, round, and reactive to light.  Cardiovascular:  Rate and Rhythm: Regular rhythm.     Chest Wall: PMI is not displaced.     Pulses: Normal pulses.     Heart sounds: Normal heart sounds. No murmur heard. Pulmonary:     Effort: Pulmonary effort is normal.     Breath sounds: Normal air entry. No rhonchi or rales.  Abdominal:     General: Abdomen is flat. Bowel sounds are normal. There is no distension.     Palpations: Abdomen is soft. There is no hepatomegaly, splenomegaly or mass.     Tenderness: There is no abdominal tenderness.  Musculoskeletal:        General: Normal range of motion.     Cervical back: Normal range of motion and neck supple.     Right lower leg: Edema present.     Left lower leg: Edema present.  Skin:    General: Skin is warm and dry.  Neurological:     General: No focal deficit present.     Mental Status: He is alert and oriented  to person, place, and time.     Cranial Nerves: No cranial nerve deficit.     Motor: No weakness.  Psychiatric:        Mood and Affect: Mood normal.        Behavior: Behavior normal.      Results for orders placed or performed in visit on 02/10/23  POC CREATINE & ALBUMIN,URINE  Result Value Ref Range   Microalbumin Ur, POC 30 mg/L   Creatinine, POC 50 mg/dL   Albumin/Creatinine Ratio, Urine, POC 30-300     Recent Results (from the past 2160 hour(Shenell Rogalski))  Comprehensive metabolic panel     Status: Abnormal   Collection Time: 02/03/23  8:33 AM  Result Value Ref Range   Glucose 100 (H) 70 - 99 mg/dL   BUN 15 8 - 27 mg/dL   Creatinine, Ser 1.91 (H) 0.76 - 1.27 mg/dL   eGFR 49 (L) >47 WG/NFA/2.13   BUN/Creatinine Ratio 10 10 - 24   Sodium 144 134 - 144 mmol/L   Potassium 4.9 3.5 - 5.2 mmol/L   Chloride 103 96 - 106 mmol/L   CO2 26 20 - 29 mmol/L   Calcium 9.7 8.6 - 10.2 mg/dL   Total Protein 6.8 6.0 - 8.5 g/dL   Albumin 4.6 3.8 - 4.8 g/dL   Globulin, Total 2.2 1.5 - 4.5 g/dL   Bilirubin Total 0.5 0.0 - 1.2 mg/dL   Alkaline Phosphatase 82 44 - 121 IU/L   AST 27 0 - 40 IU/L   ALT 24 0 - 44 IU/L  Lipid panel     Status: Abnormal   Collection Time: 02/03/23  8:33 AM  Result Value Ref Range   Cholesterol, Total 181 100 - 199 mg/dL   Triglycerides 086 (H) 0 - 149 mg/dL   HDL 47 >57 mg/dL   VLDL Cholesterol Cal 28 5 - 40 mg/dL   LDL Chol Calc (NIH) 846 (H) 0 - 99 mg/dL   Chol/HDL Ratio 3.9 0.0 - 5.0 ratio    Comment:                                   T. Chol/HDL Ratio  Men  Women                               1/2 Avg.Risk  3.4    3.3                                   Avg.Risk  5.0    4.4                                2X Avg.Risk  9.6    7.1                                3X Avg.Risk 23.4   11.0   Hemoglobin A1c     Status: Abnormal   Collection Time: 02/03/23  8:33 AM  Result Value Ref Range   Hgb A1c MFr Bld 6.3 (H) 4.8 - 5.6 %     Comment:          Prediabetes: 5.7 - 6.4          Diabetes: >6.4          Glycemic control for adults with diabetes: <7.0    Est. average glucose Bld gHb Est-mCnc 134 mg/dL  CBC With Diff/Platelet     Status: Abnormal   Collection Time: 02/03/23  8:54 AM  Result Value Ref Range   WBC 5.5 3.4 - 10.8 x10E3/uL   RBC 3.98 (L) 4.14 - 5.80 x10E6/uL   Hemoglobin 12.3 (L) 13.0 - 17.7 g/dL   Hematocrit 16.1 09.6 - 51.0 %   MCV 98 (H) 79 - 97 fL   MCH 30.9 26.6 - 33.0 pg   MCHC 31.5 31.5 - 35.7 g/dL   RDW 04.5 40.9 - 81.1 %   Platelets 216 150 - 450 x10E3/uL   Neutrophils 60 Not Estab. %   Lymphs 22 Not Estab. %   Monocytes 12 Not Estab. %   Eos 4 Not Estab. %   Basos 2 Not Estab. %   Neutrophils Absolute 3.4 1.4 - 7.0 x10E3/uL   Lymphocytes Absolute 1.2 0.7 - 3.1 x10E3/uL   Monocytes Absolute 0.6 0.1 - 0.9 x10E3/uL   EOS (ABSOLUTE) 0.2 0.0 - 0.4 x10E3/uL   Basophils Absolute 0.1 0.0 - 0.2 x10E3/uL   Immature Granulocytes 0 Not Estab. %   Immature Grans (Abs) 0.0 0.0 - 0.1 x10E3/uL  POC CREATINE & ALBUMIN,URINE     Status: None   Collection Time: 02/10/23  9:39 AM  Result Value Ref Range   Microalbumin Ur, POC 30 mg/L   Creatinine, POC 50 mg/dL   Albumin/Creatinine Ratio, Urine, POC 30-300       Assessment & Plan:  As per problem list. Problem List Items Addressed This Visit       Endocrine   Diabetes mellitus type 2 with complications (HCC)   Relevant Medications   empagliflozin (JARDIANCE) 10 MG TABS tablet   Other Relevant Orders   POC CREATINE & ALBUMIN,URINE (Completed)   Diabetic polyneuropathy (HCC)   Relevant Medications   empagliflozin (JARDIANCE) 10 MG TABS tablet     Genitourinary   Stage 3a chronic kidney disease (HCC) - Primary   Relevant Medications   empagliflozin (JARDIANCE) 10 MG TABS tablet     Other  Hyperlipidemia, unspecified    Return in about 3 months (around 05/13/2023) for fu with labs prior.   Total time spent: 20 minutes  Luna Fuse, MD  02/10/2023   This document may have been prepared by [facility] Voice Recognition software and as such may include unintentional dictation errors.

## 2023-02-16 ENCOUNTER — Ambulatory Visit (INDEPENDENT_AMBULATORY_CARE_PROVIDER_SITE_OTHER): Payer: Medicare Other | Admitting: Gastroenterology

## 2023-02-16 ENCOUNTER — Encounter: Payer: Self-pay | Admitting: Gastroenterology

## 2023-02-16 VITALS — BP 181/78 | HR 61 | Temp 98.4°F | Wt 243.0 lb

## 2023-02-16 DIAGNOSIS — Z860101 Personal history of adenomatous and serrated colon polyps: Secondary | ICD-10-CM | POA: Diagnosis not present

## 2023-02-16 DIAGNOSIS — B182 Chronic viral hepatitis C: Secondary | ICD-10-CM

## 2023-02-16 DIAGNOSIS — Z860100 Personal history of colon polyps, unspecified: Secondary | ICD-10-CM

## 2023-02-16 DIAGNOSIS — Z8619 Personal history of other infectious and parasitic diseases: Secondary | ICD-10-CM | POA: Diagnosis not present

## 2023-02-16 DIAGNOSIS — K760 Fatty (change of) liver, not elsewhere classified: Secondary | ICD-10-CM | POA: Diagnosis not present

## 2023-02-16 MED ORDER — NADOLOL 40 MG PO TABS: 40.0000 mg | ORAL_TABLET | Freq: Every day | ORAL | 3 refills | Status: DC

## 2023-02-16 NOTE — Progress Notes (Signed)
Primary Care Physician: Sherron Monday, MD  Primary Gastroenterologist:  Dr. Midge Minium  Chief Complaint  Patient presents with   Follow-up    HPI: Ian Gibson is a 77 y.o. male here with a history of cirrhosis due to hepatitis C.  The patient has been treated many years ago for his hepatitis C and had seen me back in 2023 with the recommendations for [facility] surveillance.  The patient has now contacted my office due to needing a refill of his nadolol.  The patient was told that he would have to come in and see me today. The patient's wife states that the patient has not had a colonoscopy in many years and upon checking his records it looks like his last colonoscopy was in 2015 with adenomatous polyps at that time.  He then had a repeat in 2016 with more adenomatous polyps.  The patient denies any rectal bleeding unexplained weight loss fevers chills nausea vomiting.  The patient has been treated recently for prostate cancer.  The patient is in need of his Adderall to be refilled.  The patient's most recent imaging showed signs of fatty liver but does not mention any signs of cirrhosis.  The patient platelet count and albumin are normal as well as of the liver enzymes.  The patient also had an MRI with suggestion of cirrhosis a few years ago.  Past Medical History:  Diagnosis Date   CKD (chronic kidney disease)    Hepatic cirrhosis due to chronic hepatitis C infection (HCC) 2015   Treated with Harvoni .  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  No seizures in "many" years.    Current Outpatient Medications  Medication Sig Dispense Refill   aspirin EC 81 MG tablet Take by mouth.     atorvastatin (LIPITOR) 40 MG tablet Take 1 tablet (40 mg total) by mouth every evening. 90 tablet 1   cetirizine (ZYRTEC) 10 MG tablet Take 1 tablet (10 mg total) by mouth every morning. 90 tablet 1   clobetasol cream (TEMOVATE) 0.05 % APP THIN FILM AA ON BACK BID UNTIL CLEAR  2    empagliflozin (JARDIANCE) 10 MG TABS tablet Take 1 tablet (10 mg total) by mouth daily before breakfast. 90 tablet 0   gabapentin (NEURONTIN) 800 MG tablet Take 1 tablet (800 mg total) by mouth 3 (three) times daily. 270 tablet 1   hydrochlorothiazide (MICROZIDE) 12.5 MG capsule Take 1 capsule (12.5 mg total) by mouth daily. 90 capsule 1   levETIRAcetam (KEPPRA) 500 MG tablet Take 1 tablet (500 mg total) by mouth 2 (two) times daily. 180 tablet 1   lisinopril (ZESTRIL) 40 MG tablet Take 1 tablet (40 mg total) by mouth every morning. 90 tablet 1   tamsulosin (FLOMAX) 0.4 MG CAPS capsule Take 1 capsule (0.4 mg total) by mouth daily after supper. 90 capsule 4   zolpidem (AMBIEN) 10 MG tablet Take 1 tablet (10 mg total) by mouth at bedtime. 90 tablet 0   furosemide (LASIX) 20 MG tablet Take 1 tablet (20 mg total) by mouth daily. Take daily whenever leg edema returns for 5d at a time (Patient not taking: Reported on 02/10/2023) 30 tablet 0   nadolol (CORGARD) 40 MG tablet Take 1 tablet (40 mg total) by mouth daily. 90 tablet 3   No current facility-administered medications for this visit.    Allergies as of 02/16/2023   (No Known Allergies)  ROS:  General: Negative for anorexia, weight loss, fever, chills, fatigue, weakness. ENT: Negative for hoarseness, difficulty swallowing , nasal congestion. CV: Negative for chest pain, angina, palpitations, dyspnea on exertion, peripheral edema.  Respiratory: Negative for dyspnea at rest, dyspnea on exertion, cough, sputum, wheezing.  GI: See history of present illness. GU:  Negative for dysuria, hematuria, urinary incontinence, urinary frequency, nocturnal urination.  Endo: Negative for unusual weight change.    Physical Examination:   BP (!) 181/78 (BP Location: Left Arm, Patient Position: Sitting, Cuff Size: Large)   Pulse 61   Temp 98.4 F (36.9 C) (Oral)   Wt 243 lb (110.2 kg)   BMI 32.06 kg/m   General: Well-nourished, well-developed in no  acute distress.  Eyes: No icterus. Conjunctivae pink. Neuro: Alert and oriented x 3.  Grossly intact. Skin: Warm and dry, no jaundice.   Psych: Alert and cooperative, normal mood and affect.  Labs:    Imaging Studies: No results found.  Assessment and Plan:   Ian Gibson is a 77 y.o. y/o male who comes in today with a history of hepatitis C and the diagnosis of cirrhosis before he came to see me with an MRI suggesting cirrhosis.  The most recent ultrasound and alpha-fetoprotein back in April did not show any sign of HCC.  The patient will have these repeated and will also have his blood sent off for fibrosis scoring.  The patient will also be set up for colonoscopy due to his history of colon polyps and not having a colonoscopy in some time.  The patient has been explained the plan and agrees with it.   Midge Minium, MD. Clementeen Graham    Note: This dictation was prepared with Dragon dictation along with smaller phrase technology. Any transcriptional errors that result from this process are unintentional.

## 2023-02-16 NOTE — Patient Instructions (Addendum)
Ultrasound has been schedule for November 19th, you must arrive at 8:45 AM to [facility] entrance.  You can not have anything to eat or drink after 12 midnight the day  before.   If you need to cancel or reschedule, please call [phone]

## 2023-02-18 LAB — AFP TUMOR MARKER: AFP, Serum, Tumor Marker: 3.6 ng/mL (ref 0.0–8.4)

## 2023-02-18 LAB — HCV FIBROSURE
ALPHA 2-MACROGLOBULINS, QN: 332 mg/dL — ABNORMAL HIGH (ref 110–276)
ALT (SGPT) P5P: 29 IU/L (ref 0–55)
Apolipoprotein A-1: 150 mg/dL (ref 101–178)
Bilirubin, Total: 0.3 mg/dL (ref 0.0–1.2)
Fibrosis Score: 0.58 — ABNORMAL HIGH (ref 0.00–0.21)
GGT: 28 [IU]/L (ref 0–65)
Haptoglobin: 75 mg/dL (ref 34–355)
Necroinflammat Activity Score: 0.2 — ABNORMAL HIGH (ref 0.00–0.17)

## 2023-02-23 ENCOUNTER — Ambulatory Visit
Admission: RE | Admit: 2023-02-23 | Discharge: 2023-02-23 | Disposition: A | Discharge status: Home / Self Care | Payer: Medicare Other | Source: Ambulatory Visit | Attending: Gastroenterology | Admitting: Gastroenterology

## 2023-02-23 DIAGNOSIS — K7460 Unspecified cirrhosis of liver: Secondary | ICD-10-CM | POA: Diagnosis present

## 2023-02-23 DIAGNOSIS — B182 Chronic viral hepatitis C: Secondary | ICD-10-CM | POA: Diagnosis present

## 2023-03-12 MED ORDER — PEG 3350-KCL-NABCB-NACL-NASULF 236 G PO SOLR: 4000.0000 mL | Freq: Once | ORAL | 0 refills | Status: AC

## 2023-03-12 NOTE — Addendum Note (Signed)
Addended by: Roena Malady on: 03/12/2023 09:13 AM   Modules accepted: Orders

## 2023-03-18 ENCOUNTER — Ambulatory Visit
Admission: RE | Admit: 2023-03-18 | Discharge: 2023-03-18 | Disposition: A | Discharge status: Home / Self Care | Payer: Medicare Other | Attending: Gastroenterology | Admitting: Gastroenterology

## 2023-03-18 ENCOUNTER — Other Ambulatory Visit: Payer: Self-pay

## 2023-03-18 ENCOUNTER — Ambulatory Visit: Payer: Medicare Other | Admitting: Certified Registered"

## 2023-03-18 ENCOUNTER — Encounter: Payer: Self-pay | Admitting: Gastroenterology

## 2023-03-18 ENCOUNTER — Encounter
Admission: RE | Disposition: A | Discharge status: Home / Self Care | Payer: Self-pay | Source: Home / Self Care | Attending: Gastroenterology

## 2023-03-18 DIAGNOSIS — N189 Chronic kidney disease, unspecified: Secondary | ICD-10-CM | POA: Insufficient documentation

## 2023-03-18 DIAGNOSIS — E119 Type 2 diabetes mellitus without complications: Secondary | ICD-10-CM | POA: Insufficient documentation

## 2023-03-18 DIAGNOSIS — D123 Benign neoplasm of transverse colon: Secondary | ICD-10-CM

## 2023-03-18 DIAGNOSIS — D125 Benign neoplasm of sigmoid colon: Secondary | ICD-10-CM

## 2023-03-18 DIAGNOSIS — K6289 Other specified diseases of anus and rectum: Secondary | ICD-10-CM

## 2023-03-18 DIAGNOSIS — Z6832 Body mass index (BMI) 32.0-32.9, adult: Secondary | ICD-10-CM | POA: Insufficient documentation

## 2023-03-18 DIAGNOSIS — I129 Hypertensive chronic kidney disease with stage 1 through stage 4 chronic kidney disease, or unspecified chronic kidney disease: Secondary | ICD-10-CM | POA: Insufficient documentation

## 2023-03-18 DIAGNOSIS — K635 Polyp of colon: Secondary | ICD-10-CM

## 2023-03-18 DIAGNOSIS — Z7984 Long term (current) use of oral hypoglycemic drugs: Secondary | ICD-10-CM | POA: Insufficient documentation

## 2023-03-18 DIAGNOSIS — K7460 Unspecified cirrhosis of liver: Secondary | ICD-10-CM | POA: Diagnosis not present

## 2023-03-18 DIAGNOSIS — Z1211 Encounter for screening for malignant neoplasm of colon: Secondary | ICD-10-CM | POA: Diagnosis present

## 2023-03-18 DIAGNOSIS — Z860100 Personal history of colon polyps, unspecified: Secondary | ICD-10-CM

## 2023-03-18 DIAGNOSIS — B182 Chronic viral hepatitis C: Secondary | ICD-10-CM | POA: Insufficient documentation

## 2023-03-18 HISTORY — PX: POLYPECTOMY: SHX5525

## 2023-03-18 HISTORY — PX: COLONOSCOPY WITH PROPOFOL: SHX5780

## 2023-03-18 SURGERY — COLONOSCOPY WITH PROPOFOL
Anesthesia: General

## 2023-03-18 MED ORDER — LIDOCAINE HCL (PF) 2 % IJ SOLN
INTRAMUSCULAR | Status: AC
  Filled 2023-03-18: qty 5

## 2023-03-18 MED ORDER — SODIUM CHLORIDE 0.9 % IV SOLN: INTRAVENOUS | Status: DC

## 2023-03-18 MED ORDER — LIDOCAINE HCL (CARDIAC) PF 100 MG/5ML IV SOSY
PREFILLED_SYRINGE | INTRAVENOUS | Status: DC | PRN
  Administered 2023-03-18: 100 mg via INTRAVENOUS

## 2023-03-18 MED ORDER — PROPOFOL 10 MG/ML IV BOLUS
INTRAVENOUS | Status: DC | PRN
  Administered 2023-03-18: 105 ug/kg/min via INTRAVENOUS
  Administered 2023-03-18: 100 mg via INTRAVENOUS

## 2023-03-18 MED ORDER — PROPOFOL 10 MG/ML IV BOLUS
INTRAVENOUS | Status: AC
  Filled 2023-03-18: qty 40

## 2023-03-18 NOTE — Op Note (Signed)
[facility] Gastroenterology Patient Name: Ian Gibson Procedure Date: 03/18/2023 10:07 AM MRN: [id number] Account #: [id number] Date of Birth: [birth year] Admit Type: Outpatient Age: 77 Room: [facility] ENDO ROOM 4 Gender: Male Note Status: Finalized Instrument Name: Prentice Docker [id number] Procedure:             Colonoscopy Indications:           High risk colon cancer surveillance: Personal history                         of colonic polyps Providers:             Midge Minium MD, MD Referring MD:          No Local Md, MD (Referring MD) Medicines:             Propofol per Anesthesia Complications:         No immediate complications. Procedure:             Pre-Anesthesia Assessment:                        - Prior to the procedure, a History and Physical was                         performed, and patient medications and allergies were                         reviewed. The patient's tolerance of previous                         anesthesia was also reviewed. The risks and benefits                         of the procedure and the sedation options and risks                         were discussed with the patient. All questions were                         answered, and informed consent was obtained. Prior                         Anticoagulants: The patient has taken no anticoagulant                         or antiplatelet agents. ASA Grade Assessment: II - A                         patient with mild systemic disease. After reviewing                         the risks and benefits, the patient was deemed in                         satisfactory condition to undergo the procedure.                        After obtaining informed consent, the colonoscope was  passed under direct vision. Throughout the procedure,                         the patient's blood pressure, pulse, and oxygen                         saturations were monitored continuously. The                          Colonoscope was introduced through the anus and                         advanced to the the cecum, identified by appendiceal                         orifice and ileocecal valve. Findings:      The perianal and digital rectal examinations were normal.      Two sessile polyps were found in the sigmoid colon. The polyps were 3 to       5 mm in size. These polyps were removed with a cold snare. Resection and       retrieval were complete.      Two sessile polyps were found in the transverse colon. The polyps were 3       to 4 mm in size. These polyps were removed with a cold snare. Resection       and retrieval were complete.      The mucosa vascular pattern in the rectum was locally increased. Impression:            - Two 3 to 5 mm polyps in the sigmoid colon, removed                         with a cold snare. Resected and retrieved.                        - Two 3 to 4 mm polyps in the transverse colon,                         removed with a cold snare. Resected and retrieved.                        - Increased mucosa vascular pattern in the rectum. Recommendation:        - Discharge patient to home.                        - Resume previous diet.                        - Continue present medications.                        - Await pathology results. Procedure Code(s):     --- Professional ---                        [phone], Colonoscopy, flexible; with removal of                         tumor(s), polyp(s), or other lesion(s) by snare  technique Diagnosis Code(s):     --- Professional ---                        Z86.010, Personal history of colonic polyps                        D12.5, Benign neoplasm of sigmoid colon CPT copyright 2022 American [employer] Association. All rights reserved. The codes documented in this report are preliminary and upon coder review may  be revised to meet current compliance requirements. Midge Minium MD, MD 03/18/2023 10:24:48  AM This report has been signed electronically. Number of Addenda: 0 Note Initiated On: 03/18/2023 10:07 AM Scope Withdrawal Time: 0 hours 6 minutes 44 seconds  Total Procedure Duration: 0 hours 10 minutes 29 seconds  Estimated Blood Loss:  Estimated blood loss: none.      [facility]

## 2023-03-18 NOTE — Transfer of Care (Signed)
Immediate Anesthesia Transfer of Care Note  Patient: Ian Gibson  Procedure(s) Performed: COLONOSCOPY WITH PROPOFOL POLYPECTOMY  Patient Location: Endoscopy Unit  Anesthesia Type:General  Level of Consciousness: drowsy  Airway & Oxygen Therapy: Patient Spontanous Breathing  Post-op Assessment: Report given to RN and Post -op Vital signs reviewed and stable  Post vital signs: Reviewed and stable  Last Vitals:  Vitals Value Taken Time  BP 152/79 1026  Temp 35.9 1026  Pulse 78 1026  Resp 18 1026  SpO2 97 1026    Last Pain:  Vitals:   03/18/23 0942  TempSrc: Temporal  PainSc: 0-No pain         Complications: No notable events documented.

## 2023-03-18 NOTE — H&P (Signed)
Midge Minium, MD [facility] [address], [address] [place], [place] [address] Phone:[phone] Fax : [phone]  Primary Care Physician:  Sherron Monday, MD Primary Gastroenterologist:  Dr. Servando Snare  Pre-Procedure History & Physical: HPI:  Ian Gibson is a 77 y.o. male is here for an colonoscopy.   Past Medical History:  Diagnosis Date   CKD (chronic kidney disease)    Hepatic cirrhosis due to chronic hepatitis C infection (HCC) 2015   Treated with Harvoni .  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  No seizures in "many" years.    Past Surgical History:  Procedure Laterality Date   CATARACT EXTRACTION W/PHACO Right 09/16/2021   Procedure:  7.44 00:54.4;  Surgeon: Galen Manila, MD;  Location: [facility];  Service: Ophthalmology;  Laterality: Right;   COLONOSCOPY     ESOPHAGOGASTRODUODENOSCOPY      Prior to Admission medications   Medication Sig Start Date End Date Taking? Authorizing Provider  aspirin EC 81 MG tablet Take by mouth.   Yes [provider]  atorvastatin (LIPITOR) 40 MG tablet Take 1 tablet (40 mg total) by mouth every evening. 11/04/22  Yes Tejan-Sie, S Ahmed, MD  gabapentin (NEURONTIN) 800 MG tablet Take 1 tablet (800 mg total) by mouth 3 (three) times daily. 11/04/22  Yes Sherron Monday, MD  hydrochlorothiazide (MICROZIDE) 12.5 MG capsule Take 1 capsule (12.5 mg total) by mouth daily. 11/04/22  Yes Sherron Monday, MD  levETIRAcetam (KEPPRA) 500 MG tablet Take 1 tablet (500 mg total) by mouth 2 (two) times daily. 11/04/22 05/03/23 Yes Sherron Monday, MD  lisinopril (ZESTRIL) 40 MG tablet Take 1 tablet (40 mg total) by mouth every morning. 11/04/22 05/03/23 Yes Tejan-Sie, Marcelino Freestone, MD  nadolol (CORGARD) 40 MG tablet Take 1 tablet (40 mg total) by mouth daily. 02/16/23  Yes Midge Minium, MD  tamsulosin (FLOMAX) 0.4 MG CAPS capsule Take 1 capsule (0.4 mg total) by mouth daily after supper. 05/20/22  Yes Chrystal, Sherrine Maples, MD   zolpidem (AMBIEN) 10 MG tablet Take 1 tablet (10 mg total) by mouth at bedtime. 02/03/23  Yes Sherron Monday, MD  cetirizine (ZYRTEC) 10 MG tablet Take 1 tablet (10 mg total) by mouth every morning. 11/04/22   Sherron Monday, MD  clobetasol cream (TEMOVATE) 0.05 % APP THIN FILM AA ON BACK BID UNTIL CLEAR 07/14/17   [provider]  empagliflozin (JARDIANCE) 10 MG TABS tablet Take 1 tablet (10 mg total) by mouth daily before breakfast. Patient not taking: Reported on 03/18/2023 02/10/23 05/11/23  Sherron Monday, MD  furosemide (LASIX) 20 MG tablet Take 1 tablet (20 mg total) by mouth daily. Take daily whenever leg edema returns for 5d at a time Patient not taking: Reported on 02/10/2023 11/04/22 12/04/22  Sherron Monday, MD    Allergies as of 02/16/2023   (No Known Allergies)    History reviewed. No pertinent family history.  Social History   Socioeconomic History   Marital status: Married    Spouse name: Not on file   Number of children: Not on file   Years of education: Not on file   Highest education level: Not on file  Occupational History   Not on file  Tobacco Use   Smoking status: Never   Smokeless tobacco: Never  Vaping Use   Vaping status: Never Used  Substance and Sexual Activity   Alcohol use: Never   Drug use: Never  Sexual activity: Not on file  Other Topics Concern   Not on file  Social History Narrative   Not on file   Social Drivers of Health   Financial Resource Strain: Not on file  Food Insecurity: Not on file  Transportation Needs: Not on file  Physical Activity: Not on file  Stress: Not on file  Social Connections: Not on file  Intimate Partner Violence: Not on file    Review of Systems: See HPI, otherwise negative ROS  Physical Exam: BP (!) 157/83   Pulse 76   Temp (!) 96.8 F (36 C) (Temporal)   Resp 18   Ht 6\' 1"  (1.854 m)   Wt 111.6 kg   SpO2 100%   BMI 32.46 kg/m  General:   Alert,  pleasant and cooperative in  NAD Head:  Normocephalic and atraumatic. Neck:  Supple; no masses or thyromegaly. Lungs:  Clear throughout to auscultation.    Heart:  Regular rate and rhythm. Abdomen:  Soft, nontender and nondistended. Normal bowel sounds, without guarding, and without rebound.   Neurologic:  Alert and  oriented x4;  grossly normal neurologically.  Impression/Plan: Ian Gibson is here for an colonoscopy to be performed for a history of adenomatous polyps on 2015   Risks, benefits, limitations, and alternatives regarding  colonoscopy have been reviewed with the patient.  Questions have been answered.  All parties agreeable.   Midge Minium, MD  03/18/2023, 9:55 AM

## 2023-03-18 NOTE — Anesthesia Preprocedure Evaluation (Signed)
Anesthesia Evaluation  Patient identified by MRN, date of birth, ID band Patient awake    Reviewed: Allergy & Precautions, H&P , NPO status , Patient's Chart, lab work & pertinent test results, reviewed documented beta blocker date and time   History of Anesthesia Complications Negative for: history of anesthetic complications  Airway Mallampati: II  TM Distance: >3 FB Neck ROM: full    Dental  (+) Dental Advidsory Given, Edentulous Upper, Edentulous Lower   Pulmonary neg shortness of breath, neg COPD, Recent URI    Pulmonary exam normal breath sounds clear to auscultation       Cardiovascular Exercise Tolerance: Good hypertension, (-) angina (-) Past MI and (-) Cardiac Stents Normal cardiovascular exam(-) dysrhythmias (-) Valvular Problems/Murmurs Rhythm:regular Rate:Normal     Neuro/Psych Seizures -, Well Controlled,   Neuromuscular disease  negative psych ROS   GI/Hepatic negative GI ROS,,,(+) Hepatitis - (s/p treatment), C  Endo/Other  diabetes  Class 3 obesity  Renal/GU CRFRenal disease  negative genitourinary   Musculoskeletal   Abdominal   Peds  Hematology negative hematology ROS (+)   Anesthesia Other Findings Past Medical History: No date: CKD (chronic kidney disease) 2015: Hepatic cirrhosis due to chronic hepatitis C infection (HCC)     Comment:  Treated with Harvoni .  "cured" No date: Hypertension No date: Seizure (HCC)     Comment:  on Keppra.  No seizures in "many" years.   Reproductive/Obstetrics negative OB ROS                             Anesthesia Physical Anesthesia Plan  ASA: 2  Anesthesia Plan: General   Post-op Pain Management:    Induction: Intravenous  PONV Risk Score and Plan: 2 and Propofol infusion and TIVA  Airway Management Planned: Natural Airway and Nasal Cannula  Additional Equipment:   Intra-op Plan:   Post-operative Plan:   Informed  Consent: I have reviewed the patients History and Physical, chart, labs and discussed the procedure including the risks, benefits and alternatives for the proposed anesthesia with the patient or authorized representative who has indicated his/her understanding and acceptance.     Dental Advisory Given  Plan Discussed with: Anesthesiologist, CRNA and Surgeon  Anesthesia Plan Comments:        Anesthesia Quick Evaluation

## 2023-03-19 ENCOUNTER — Encounter: Payer: Self-pay | Admitting: Gastroenterology

## 2023-03-19 LAB — SURGICAL PATHOLOGY

## 2023-03-22 ENCOUNTER — Encounter: Payer: Self-pay | Admitting: Gastroenterology

## 2023-03-25 NOTE — Anesthesia Postprocedure Evaluation (Signed)
Anesthesia Post Note  Patient: Ian Gibson  Procedure(s) Performed: COLONOSCOPY WITH PROPOFOL POLYPECTOMY  Patient location during evaluation: Endoscopy Anesthesia Type: General Level of consciousness: awake and alert Pain management: pain level controlled Vital Signs Assessment: post-procedure vital signs reviewed and stable Respiratory status: spontaneous breathing, nonlabored ventilation, respiratory function stable and patient connected to nasal cannula oxygen Cardiovascular status: blood pressure returned to baseline and stable Postop Assessment: no apparent nausea or vomiting Anesthetic complications: no   No notable events documented.   Last Vitals:  Vitals:   03/18/23 1050 03/18/23 1053  BP: (!) 147/87 (!) 147/87  Pulse: 66 68  Resp: 17 18  Temp:    SpO2: 97% 98%    Last Pain:  Vitals:   03/18/23 1030  TempSrc: Temporal  PainSc:                  Lenard Simmer

## 2023-04-08 ENCOUNTER — Other Ambulatory Visit: Payer: Self-pay | Admitting: *Deleted

## 2023-04-08 DIAGNOSIS — C61 Malignant neoplasm of prostate: Secondary | ICD-10-CM

## 2023-04-14 ENCOUNTER — Inpatient Hospital Stay: Payer: Medicare Other | Attending: Radiation Oncology

## 2023-04-14 DIAGNOSIS — C61 Malignant neoplasm of prostate: Secondary | ICD-10-CM | POA: Insufficient documentation

## 2023-04-15 LAB — PSA: Prostatic Specific Antigen: 0.31 ng/mL (ref 0.00–4.00)

## 2023-04-21 ENCOUNTER — Telehealth: Payer: Self-pay

## 2023-04-21 ENCOUNTER — Ambulatory Visit
Admission: RE | Admit: 2023-04-21 | Discharge: 2023-04-21 | Disposition: A | Discharge status: Home / Self Care | Payer: Medicare Other | Source: Ambulatory Visit | Attending: Radiation Oncology | Admitting: Radiation Oncology

## 2023-04-21 ENCOUNTER — Encounter: Payer: Self-pay | Admitting: Radiation Oncology

## 2023-04-21 VITALS — BP 169/75 | HR 53 | Temp 96.1°F | Resp 16 | Ht 73.0 in | Wt 250.5 lb

## 2023-04-21 DIAGNOSIS — Z923 Personal history of irradiation: Secondary | ICD-10-CM | POA: Diagnosis not present

## 2023-04-21 DIAGNOSIS — C61 Malignant neoplasm of prostate: Secondary | ICD-10-CM | POA: Insufficient documentation

## 2023-04-21 NOTE — Progress Notes (Signed)
 Radiation Oncology Follow up Note  Name: Ian Gibson   Date:   04/21/2023 MRN:  [id number] DOB: 1946-02-02    This 78 y.o. male presents to the clinic today for 66-month follow-up status post IMRT radiation therapy for Gleason 7 (3+4) adenocarcinoma presenting with a PSA in the 16 range.  REFERRING PROVIDER: Shari Daughters, MD  HPI: Patient is a 78 year old male now out 10 months having completed image guided IMRT radiation therapy for Gleason 7 adenocarcinoma the prostate seen today in routine follow-up he is extremely low side effect profile.  He specifically denies any increased lower urinary tract symptoms diarrhea or fatigue.  His most recent PSA is.  0.31 slightly up from 0.016 months ago.  COMPLICATIONS OF TREATMENT: none  FOLLOW UP COMPLIANCE: keeps appointments   PHYSICAL EXAM:  BP (!) 169/75   Pulse (!) 53   Temp (!) 96.1 F (35.6 C) (Tympanic)   Resp 16   Ht 6\' 1"  (1.854 m)   Wt 250 lb 8 oz (113.6 kg)   BMI 33.05 kg/m  Well-developed well-nourished patient in NAD. HEENT reveals PERLA, EOMI, discs not visualized.  Oral cavity is clear. No oral mucosal lesions are identified. Neck is clear without evidence of cervical or supraclavicular adenopathy. Lungs are clear to A&P. Cardiac examination is essentially unremarkable with regular rate and rhythm without murmur rub or thrill. Abdomen is benign with no organomegaly or masses noted. Motor sensory and DTR levels are equal and symmetric in the upper and lower extremities. Cranial nerves II through XII are grossly intact. Proprioception is intact. No peripheral adenopathy or edema is identified. No motor or sensory levels are noted. Crude visual fields are within normal range.  RADIOLOGY RESULTS: No current films to review  PLAN: Present time patient is doing well.  Slight uptick in his PSA although we will continue to monitor that and see him back in 6 months with a repeat PSA.  Patient has an excellent side effect profile.   Patient knows to call with any concerns.  I would like to take this opportunity to thank you for allowing me to participate in the care of your patient.Glenis Langdon, MD

## 2023-04-21 NOTE — Telephone Encounter (Signed)
 Pt's spouse lmovm stating that the pharmacy told her pt did not have refills on Nadolol ... Called spouse, no answer and no VM set up  I called [facility] and they say they have the Rx on file, however the copay is over $300...  Please advise on an alternative to send in.Aaron AasAaron Aas

## 2023-04-23 MED ORDER — NADOLOL 40 MG PO TABS: 40.0000 mg | ORAL_TABLET | Freq: Every day | ORAL | 11 refills | Status: DC

## 2023-04-23 NOTE — Telephone Encounter (Signed)
I called [facility] and a PA is not needed, it was a quantity limit. Insurance will only cover 30 days at a time... I submitted a PA via [facility] for quantity limit exception for 90 day supply... #[id number]... I spoke to Long with [facility]

## 2023-04-23 NOTE — Addendum Note (Signed)
Addended by: Roena Malady on: 04/23/2023 11:01 AM   Modules accepted: Orders

## 2023-05-05 ENCOUNTER — Other Ambulatory Visit: Payer: Self-pay | Admitting: Internal Medicine

## 2023-05-05 DIAGNOSIS — E782 Mixed hyperlipidemia: Secondary | ICD-10-CM

## 2023-05-05 DIAGNOSIS — G40409 Other generalized epilepsy and epileptic syndromes, not intractable, without status epilepticus: Secondary | ICD-10-CM

## 2023-05-05 DIAGNOSIS — E1342 Other specified diabetes mellitus with diabetic polyneuropathy: Secondary | ICD-10-CM

## 2023-05-05 DIAGNOSIS — I10 Essential (primary) hypertension: Secondary | ICD-10-CM

## 2023-05-07 ENCOUNTER — Other Ambulatory Visit: Payer: Self-pay

## 2023-05-07 ENCOUNTER — Other Ambulatory Visit: Payer: Medicare Other

## 2023-05-07 DIAGNOSIS — E119 Type 2 diabetes mellitus without complications: Secondary | ICD-10-CM

## 2023-05-07 DIAGNOSIS — E782 Mixed hyperlipidemia: Secondary | ICD-10-CM

## 2023-05-07 DIAGNOSIS — N1831 Chronic kidney disease, stage 3a: Secondary | ICD-10-CM

## 2023-05-08 LAB — COMPREHENSIVE METABOLIC PANEL
ALT: 23 [IU]/L (ref 0–44)
AST: 25 [IU]/L (ref 0–40)
Albumin: 4.5 g/dL (ref 3.8–4.8)
Alkaline Phosphatase: 73 [IU]/L (ref 44–121)
BUN/Creatinine Ratio: 8 — ABNORMAL LOW (ref 10–24)
BUN: 12 mg/dL (ref 8–27)
Bilirubin Total: 0.4 mg/dL (ref 0.0–1.2)
CO2: 27 mmol/L (ref 20–29)
Calcium: 9.9 mg/dL (ref 8.6–10.2)
Chloride: 105 mmol/L (ref 96–106)
Creatinine, Ser: 1.55 mg/dL — ABNORMAL HIGH (ref 0.76–1.27)
Globulin, Total: 2.3 g/dL (ref 1.5–4.5)
Glucose: 108 mg/dL — ABNORMAL HIGH (ref 70–99)
Potassium: 5.3 mmol/L — ABNORMAL HIGH (ref 3.5–5.2)
Sodium: 148 mmol/L — ABNORMAL HIGH (ref 134–144)
Total Protein: 6.8 g/dL (ref 6.0–8.5)
eGFR: 46 mL/min/{1.73_m2} — ABNORMAL LOW (ref >59)

## 2023-05-08 LAB — LIPID PANEL
Chol/HDL Ratio: 3.9 {ratio} (ref 0.0–5.0)
Cholesterol, Total: 172 mg/dL (ref 100–199)
HDL: 44 mg/dL (ref >39)
LDL Chol Calc (NIH): 97 mg/dL (ref 0–99)
Triglycerides: 177 mg/dL — ABNORMAL HIGH (ref 0–149)
VLDL Cholesterol Cal: 31 mg/dL (ref 5–40)

## 2023-05-08 LAB — HEMOGLOBIN A1C
Est. average glucose Bld gHb Est-mCnc: 123 mg/dL
Hgb A1c MFr Bld: 5.9 % — ABNORMAL HIGH (ref 4.8–5.6)

## 2023-05-12 ENCOUNTER — Other Ambulatory Visit: Payer: Self-pay | Admitting: Internal Medicine

## 2023-05-12 ENCOUNTER — Ambulatory Visit: Payer: Medicare Other | Admitting: Internal Medicine

## 2023-05-12 ENCOUNTER — Encounter: Payer: Self-pay | Admitting: Internal Medicine

## 2023-05-12 VITALS — BP 138/82 | HR 75 | Temp 95.4°F | Ht 73.0 in | Wt 250.4 lb

## 2023-05-12 DIAGNOSIS — E782 Mixed hyperlipidemia: Secondary | ICD-10-CM

## 2023-05-12 DIAGNOSIS — J301 Allergic rhinitis due to pollen: Secondary | ICD-10-CM

## 2023-05-12 DIAGNOSIS — E119 Type 2 diabetes mellitus without complications: Secondary | ICD-10-CM

## 2023-05-12 DIAGNOSIS — E870 Hyperosmolality and hypernatremia: Secondary | ICD-10-CM | POA: Diagnosis not present

## 2023-05-12 DIAGNOSIS — E875 Hyperkalemia: Secondary | ICD-10-CM

## 2023-05-12 DIAGNOSIS — N1831 Chronic kidney disease, stage 3a: Secondary | ICD-10-CM

## 2023-05-12 DIAGNOSIS — E118 Type 2 diabetes mellitus with unspecified complications: Secondary | ICD-10-CM | POA: Diagnosis not present

## 2023-05-12 LAB — POCT CBG (FASTING - GLUCOSE)-MANUAL ENTRY: Glucose Fasting, POC: 121 mg/dL — AB (ref 70–99)

## 2023-05-12 MED ORDER — CETIRIZINE HCL 10 MG PO TABS: 10.0000 mg | ORAL_TABLET | Freq: Every morning | ORAL | 1 refills | Status: DC

## 2023-05-12 NOTE — Progress Notes (Signed)
 Established Patient Office Visit  Subjective:  Patient ID: Ian Gibson, male    DOB: 1945/12/10  Age: 78 y.o. MRN: [id number]  Chief Complaint  Patient presents with   Follow-up    3 month follow up, discuss lab results.    No new complaints, here for lab review and medication refills. Labs reviewed and notable for well controlled diabetes, A1c at target, lipids at target with cmp notable for hypernatremia, hyperkalemia and stable ckd 3. Denies any hypoglycemic episodes and home bg readings have been at target.     No other concerns at this time.   Past Medical History:  Diagnosis Date   CKD (chronic kidney disease)    Hepatic cirrhosis due to chronic hepatitis C infection (HCC) 2015   Treated with Harvoni .  cured   Hypertension    Seizure (HCC)    on Keppra .  No seizures in many years.    Past Surgical History:  Procedure Laterality Date   CATARACT EXTRACTION W/PHACO Right 09/16/2021   Procedure:  7.44 00:54.4;  Surgeon: Jaye Fallow, MD;  Location: [facility];  Service: Ophthalmology;  Laterality: Right;   COLONOSCOPY     COLONOSCOPY WITH PROPOFOL  N/A 03/18/2023   Procedure: COLONOSCOPY WITH PROPOFOL ;  Surgeon: Jinny Carmine, MD;  Location: [facility] END[facility]COPY;  Service: Endoscopy;  Laterality: N/A;   ESOPHAGOGASTRODUODENOSCOPY     POLYPECTOMY  03/18/2023   Procedure: POLYPECTOMY;  Surgeon: Jinny Carmine, MD;  Location: [facility] ENDOSCOPY;  Service: Endoscopy;;    Social History   Socioeconomic History   Marital status: Married    Spouse name: Not on file   Number of children: Not on file   Years of education: Not on file   Highest education level: Not on file  Occupational History   Not on file  Tobacco Use   Smoking status: Never   Smokeless tobacco: Never  Vaping Use   Vaping status: Never Used  Substance and Sexual Activity   Alcohol use: Never   Drug use: Never   Sexual activity: Not on file  Other Topics Concern   Not on file  Social  History Narrative   Not on file   Social Drivers of Health   Financial Resource Strain: Not on file  Food Insecurity: Not on file  Transportation Needs: Not on file  Physical Activity: Not on file  Stress: Not on file  Social Connections: Not on file  Intimate Partner Violence: Not on file    No family history on file.  No Known Allergies  Outpatient Medications Prior to Visit  Medication Sig   aspirin EC 81 MG tablet Take by mouth.   atorvastatin  (LIPITOR) 40 MG tablet TAKE 1 TABLET(40 MG) BY MOUTH EVERY EVENING   cetirizine  (ZYRTEC ) 10 MG tablet Take 1 tablet (10 mg total) by mouth every morning.   clobetasol cream (TEMOVATE) 0.05 % APP THIN FILM AA ON BACK BID UNTIL CLEAR   gabapentin  (NEURONTIN ) 800 MG tablet TAKE 1 TABLET(800 MG) BY MOUTH THREE TIMES DAILY   hydrochlorothiazide  (MICROZIDE ) 12.5 MG capsule TAKE 1 CAPSULE(12.5 MG) BY MOUTH DAILY   levETIRAcetam  (KEPPRA ) 500 MG tablet TAKE 1 TABLET(500 MG) BY MOUTH TWICE DAILY   lisinopril  (ZESTRIL ) 40 MG tablet TAKE 1 TABLET(40 MG) BY MOUTH EVERY MORNING   nadolol  (CORGARD ) 40 MG tablet Take 1 tablet (40 mg total) by mouth daily.   tamsulosin  (FLOMAX ) 0.4 MG CAPS capsule Take 1 capsule (0.4 mg total) by mouth daily after supper.   zolpidem  (  AMBIEN ) 10 MG tablet TAKE 1 TABLET(10 MG) BY MOUTH AT BEDTIME   No facility-administered medications prior to visit.    Review of Systems  Constitutional:  Negative for weight loss (gained 19 lbs).  HENT: Negative.    Eyes: Negative.   Respiratory: Negative.    Cardiovascular: Negative.   Gastrointestinal: Negative.   Genitourinary: Negative.  Negative for frequency and urgency.  Musculoskeletal:  Positive for back pain.  Skin: Negative.   Neurological: Negative.  Negative for seizures.  Endo/Heme/Allergies: Negative.   Psychiatric/Behavioral: Negative.         Objective:   BP 138/82   Pulse 75   Temp (!) 95.4 F (35.2 C) (Tympanic)   Ht 6' 1 (1.854 m)   Wt 250 lb 6.4  oz (113.6 kg)   SpO2 96%   BMI 33.04 kg/m   Vitals:   05/12/23 0823  BP: 138/82  Pulse: 75  Temp: (!) 95.4 F (35.2 C)  Height: 6' 1 (1.854 m)  Weight: 250 lb 6.4 oz (113.6 kg)  SpO2: 96%  TempSrc: Tympanic  BMI (Calculated): 33.04    Physical Exam Vitals reviewed.  Constitutional:      Appearance: Normal appearance.  HENT:     Head: Normocephalic.     Left Ear: There is no impacted cerumen.     Nose: Nose normal.     Mouth/Throat:     Mouth: Mucous membranes are moist.     Pharynx: No posterior oropharyngeal erythema.  Eyes:     Extraocular Movements: Extraocular movements intact.     Pupils: Pupils are equal, round, and reactive to light.  Cardiovascular:     Rate and Rhythm: Regular rhythm.     Chest Wall: PMI is not displaced.     Pulses: Normal pulses.     Heart sounds: Normal heart sounds. No murmur heard. Pulmonary:     Effort: Pulmonary effort is normal.     Breath sounds: Normal air entry. No rhonchi or rales.  Abdominal:     General: Abdomen is flat. Bowel sounds are normal. There is no distension.     Palpations: Abdomen is soft. There is no hepatomegaly, splenomegaly or mass.     Tenderness: There is no abdominal tenderness.  Musculoskeletal:        General: Normal range of motion.     Cervical back: Normal range of motion and neck supple.     Right lower leg: Edema present.     Left lower leg: Edema present.  Skin:    General: Skin is warm and dry.  Neurological:     General: No focal deficit present.     Mental Status: He is alert and oriented to person, place, and time.     Cranial Nerves: No cranial nerve deficit.     Motor: No weakness.  Psychiatric:        Mood and Affect: Mood normal.        Behavior: Behavior normal.      Results for orders placed or performed in visit on 05/12/23  POCT CBG (Fasting - Glucose)  Result Value Ref Range   Glucose Fasting, POC 121 (A) 70 - 99 mg/dL    Lab Results  Component Value Date   HGBA1C  5.9 (H) 05/07/2023   HGBA1C 6.3 (H) 02/03/2023   HGBA1C 5.7 (H) 11/04/2022   CMP     Component Value Date/Time   NA 148 (H) 05/07/2023 0830   NA 137 02/23/2013 1149   K 5.3 (  H) 05/07/2023 0830   K 4.0 02/23/2013 1149   CL 105 05/07/2023 0830   CL 104 02/23/2013 1149   CO2 27 05/07/2023 0830   CO2 32 02/23/2013 1149   GLUCOSE 108 (H) 05/07/2023 0830   GLUCOSE 95 02/23/2013 1149   BUN 12 05/07/2023 0830   BUN 8 02/23/2013 1149   CREATININE 1.55 (H) 05/07/2023 0830   CREATININE 1.08 02/23/2013 1149   CALCIUM  9.9 05/07/2023 0830   CALCIUM  9.1 02/23/2013 1149   PROT 6.8 05/07/2023 0830   PROT 7.1 02/23/2013 1149   ALBUMIN 4.5 05/07/2023 0830   ALBUMIN 3.8 02/23/2013 1149   AST 25 05/07/2023 0830   AST 50 (H) 02/23/2013 1149   ALT 23 05/07/2023 0830   ALT 33 02/23/2013 1149   ALKPHOS 73 05/07/2023 0830   ALKPHOS 89 02/23/2013 1149   BILITOT 0.4 05/07/2023 0830   BILITOT 0.7 02/23/2013 1149   EGFR 46 (L) 05/07/2023 0830   GFRNONAA >60 02/23/2013 1149   Lipid Panel     Component Value Date/Time   CHOL 172 05/07/2023 0830   TRIG 177 (H) 05/07/2023 0830   HDL 44 05/07/2023 0830   CHOLHDL 3.9 05/07/2023 0830   LDLCALC 97 05/07/2023 0830   LABVLDL 31 05/07/2023 0830       Assessment & Plan:  As per problem list, increase fluid intake. 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   Total time spent: 20 minutes  Sherrill Cinderella Perry, MD  05/12/2023   This document may have been prepared by [facility] Voice Recognition software and as such may include unintentional dictation errors.

## 2023-05-13 LAB — COMPREHENSIVE METABOLIC PANEL
ALT: 20 [IU]/L (ref 0–44)
AST: 36 [IU]/L (ref 0–40)
Albumin: 4.8 g/dL (ref 3.8–4.8)
Alkaline Phosphatase: 74 [IU]/L (ref 44–121)
BUN/Creatinine Ratio: 9 — ABNORMAL LOW (ref 10–24)
BUN: 12 mg/dL (ref 8–27)
Bilirubin Total: 0.5 mg/dL (ref 0.0–1.2)
CO2: 21 mmol/L (ref 20–29)
Calcium: 9.9 mg/dL (ref 8.6–10.2)
Chloride: 102 mmol/L (ref 96–106)
Creatinine, Ser: 1.39 mg/dL — ABNORMAL HIGH (ref 0.76–1.27)
Globulin, Total: 2.6 g/dL (ref 1.5–4.5)
Glucose: 117 mg/dL — ABNORMAL HIGH (ref 70–99)
Potassium: 5.9 mmol/L — ABNORMAL HIGH (ref 3.5–5.2)
Sodium: 143 mmol/L (ref 134–144)
Total Protein: 7.4 g/dL (ref 6.0–8.5)
eGFR: 53 mL/min/{1.73_m2} — ABNORMAL LOW (ref >59)

## 2023-05-13 LAB — LIPID PANEL
Chol/HDL Ratio: 3.6 {ratio} (ref 0.0–5.0)
Cholesterol, Total: 164 mg/dL (ref 100–199)
HDL: 46 mg/dL (ref >39)
LDL Chol Calc (NIH): 94 mg/dL (ref 0–99)
Triglycerides: 136 mg/dL (ref 0–149)
VLDL Cholesterol Cal: 24 mg/dL (ref 5–40)

## 2023-05-13 LAB — HEMOGLOBIN A1C
Est. average glucose Bld gHb Est-mCnc: 126 mg/dL
Hgb A1c MFr Bld: 6 % — ABNORMAL HIGH (ref 4.8–5.6)

## 2023-05-13 LAB — POTASSIUM: Potassium: 5 mmol/L (ref 3.5–5.2)

## 2023-08-05 ENCOUNTER — Other Ambulatory Visit: Payer: Self-pay

## 2023-08-05 ENCOUNTER — Other Ambulatory Visit

## 2023-08-05 DIAGNOSIS — I10 Essential (primary) hypertension: Secondary | ICD-10-CM

## 2023-08-05 DIAGNOSIS — E119 Type 2 diabetes mellitus without complications: Secondary | ICD-10-CM

## 2023-08-05 DIAGNOSIS — E782 Mixed hyperlipidemia: Secondary | ICD-10-CM

## 2023-08-05 DIAGNOSIS — E1342 Other specified diabetes mellitus with diabetic polyneuropathy: Secondary | ICD-10-CM

## 2023-08-05 DIAGNOSIS — G40409 Other generalized epilepsy and epileptic syndromes, not intractable, without status epilepticus: Secondary | ICD-10-CM

## 2023-08-05 DIAGNOSIS — N1831 Chronic kidney disease, stage 3a: Secondary | ICD-10-CM

## 2023-08-05 DIAGNOSIS — J301 Allergic rhinitis due to pollen: Secondary | ICD-10-CM

## 2023-08-06 LAB — LIPID PANEL
Chol/HDL Ratio: 3 ratio (ref 0.0–5.0)
Cholesterol, Total: 136 mg/dL (ref 100–199)
HDL: 46 mg/dL (ref >39)
LDL Chol Calc (NIH): 71 mg/dL (ref 0–99)
Triglycerides: 105 mg/dL (ref 0–149)
VLDL Cholesterol Cal: 19 mg/dL (ref 5–40)

## 2023-08-06 LAB — COMPREHENSIVE METABOLIC PANEL WITH GFR
ALT: 17 IU/L (ref 0–44)
AST: 26 IU/L (ref 0–40)
Albumin: 4.2 g/dL (ref 3.8–4.8)
Alkaline Phosphatase: 70 IU/L (ref 44–121)
BUN/Creatinine Ratio: 8 — ABNORMAL LOW (ref 10–24)
BUN: 13 mg/dL (ref 8–27)
Bilirubin Total: 0.5 mg/dL (ref 0.0–1.2)
CO2: 25 mmol/L (ref 20–29)
Calcium: 9.4 mg/dL (ref 8.6–10.2)
Chloride: 105 mmol/L (ref 96–106)
Creatinine, Ser: 1.67 mg/dL — ABNORMAL HIGH (ref 0.76–1.27)
Globulin, Total: 2.3 g/dL (ref 1.5–4.5)
Glucose: 105 mg/dL — ABNORMAL HIGH (ref 70–99)
Potassium: 4.7 mmol/L (ref 3.5–5.2)
Sodium: 144 mmol/L (ref 134–144)
Total Protein: 6.5 g/dL (ref 6.0–8.5)
eGFR: 42 mL/min/{1.73_m2} — ABNORMAL LOW (ref >59)

## 2023-08-06 LAB — HEMOGLOBIN A1C
Est. average glucose Bld gHb Est-mCnc: 137 mg/dL
Hgb A1c MFr Bld: 6.4 % — ABNORMAL HIGH (ref 4.8–5.6)

## 2023-08-06 MED ORDER — LISINOPRIL 40 MG PO TABS: 40.0000 mg | ORAL_TABLET | Freq: Every day | ORAL | 1 refills | Status: DC

## 2023-08-06 MED ORDER — ATORVASTATIN CALCIUM 40 MG PO TABS
40.0000 mg | ORAL_TABLET | Freq: Every day | ORAL | 1 refills | Status: DC
Start: 2023-08-06 — End: 2024-02-04

## 2023-08-06 MED ORDER — LEVETIRACETAM 500 MG PO TABS: 500.0000 mg | ORAL_TABLET | Freq: Two times a day (BID) | ORAL | 1 refills | Status: DC

## 2023-08-06 MED ORDER — HYDROCHLOROTHIAZIDE 12.5 MG PO CAPS: 12.5000 mg | ORAL_CAPSULE | Freq: Every day | ORAL | 1 refills | Status: DC

## 2023-08-06 MED ORDER — ZOLPIDEM TARTRATE 10 MG PO TABS: 10.0000 mg | ORAL_TABLET | Freq: Every day | ORAL | 0 refills | Status: DC

## 2023-08-06 MED ORDER — CETIRIZINE HCL 10 MG PO TABS: 10.0000 mg | ORAL_TABLET | Freq: Every morning | ORAL | 1 refills | Status: DC

## 2023-08-06 MED ORDER — GABAPENTIN 800 MG PO TABS: 800.0000 mg | ORAL_TABLET | Freq: Three times a day (TID) | ORAL | 1 refills | Status: DC

## 2023-08-11 ENCOUNTER — Encounter: Payer: Self-pay | Admitting: Internal Medicine

## 2023-08-11 ENCOUNTER — Ambulatory Visit: Payer: Medicare Other | Admitting: Internal Medicine

## 2023-08-11 VITALS — BP 140/80 | HR 67 | Temp 97.9°F | Ht 73.0 in | Wt 250.6 lb

## 2023-08-11 DIAGNOSIS — E118 Type 2 diabetes mellitus with unspecified complications: Secondary | ICD-10-CM

## 2023-08-11 DIAGNOSIS — E1342 Other specified diabetes mellitus with diabetic polyneuropathy: Secondary | ICD-10-CM | POA: Diagnosis not present

## 2023-08-11 DIAGNOSIS — E782 Mixed hyperlipidemia: Secondary | ICD-10-CM

## 2023-08-11 DIAGNOSIS — I10 Essential (primary) hypertension: Secondary | ICD-10-CM | POA: Diagnosis not present

## 2023-08-11 DIAGNOSIS — N1831 Chronic kidney disease, stage 3a: Secondary | ICD-10-CM | POA: Diagnosis not present

## 2023-08-11 LAB — GLUCOSE, POCT (MANUAL RESULT ENTRY): POC Glucose: 118 mg/dL — AB (ref 70–99)

## 2023-08-11 MED ORDER — OLMESARTAN MEDOXOMIL 40 MG PO TABS: 40.0000 mg | ORAL_TABLET | Freq: Every day | ORAL | 0 refills | Status: DC

## 2023-08-11 NOTE — Addendum Note (Signed)
 Addended byFrancie Irani on: 08/11/2023 09:35 AM   Modules accepted: Orders

## 2023-08-11 NOTE — Progress Notes (Addendum)
 Established Patient Office Visit  Subjective:  Patient ID: Ian Gibson, male    DOB: [birth year]  Age: 78 y.o. MRN: [id number]  Chief Complaint  Patient presents with   Follow-up    3 months follow up    No new complaints, here for lab review and medication refills. Labs reviewed and notable for well controlled diabetes, A1c higher but at target, lipids at target with cmp notable for stable CKD. Denies any hypoglycemic episodes and home bg readings have been at target.     No other concerns at this time.   Past Medical History:  Diagnosis Date   CKD (chronic kidney disease)    Hepatic cirrhosis due to chronic hepatitis C infection (HCC) 2015   Treated with Harvoni .  cured   Hypertension    Seizure (HCC)    on Keppra .  No seizures in many years.    Past Surgical History:  Procedure Laterality Date   CATARACT EXTRACTION W/PHACO Right 09/16/2021   Procedure:  7.44 00:54.4;  Surgeon: Jaye Fallow, MD;  Location: [facility];  Service: Ophthalmology;  Laterality: Right;   COLONOSCOPY     COLONOSCOPY WITH PROPOFOL  N/A 03/18/2023   Procedure: COLONOSCOPY WITH PROPOFOL ;  Surgeon: Jinny Carmine, MD;  Location: [facility];  Service: Endoscopy;  Laterality: N/A;   ESOPHAGOGASTRODUODENOSCOPY     POLYPECTOMY  03/18/2023   Procedure: POLYPECTOMY;  Surgeon: Jinny Carmine, MD;  Location: [facility] ENDOSCOPY;  Service: Endoscopy;;    Social History   Socioeconomic History   Marital status: Married    Spouse name: Not on file   Number of children: Not on file   Years of education: Not on file   Highest education level: Not on file  Occupational History   Not on file  Tobacco Use   Smoking status: Never   Smokeless tobacco: Never  Vaping Use   Vaping status: Never Used  Substance and Sexual Activity   Alcohol use: Never   Drug use: Never   Sexual activity: Not on file  Other Topics Concern   Not on file  Social History Narrative   Not on file   Social  Drivers of Health   Financial Resource Strain: Not on file  Food Insecurity: Not on file  Transportation Needs: Not on file  Physical Activity: Not on file  Stress: Not on file  Social Connections: Not on file  Intimate Partner Violence: Not on file    No family history on file.  No Known Allergies  Outpatient Medications Prior to Visit  Medication Sig   aspirin EC 81 MG tablet Take by mouth.   atorvastatin  (LIPITOR) 40 MG tablet Take 1 tablet (40 mg total) by mouth daily.   cetirizine  (ZYRTEC ) 10 MG tablet Take 1 tablet (10 mg total) by mouth every morning.   clobetasol cream (TEMOVATE) 0.05 % APP THIN FILM AA ON BACK BID UNTIL CLEAR   gabapentin  (NEURONTIN ) 800 MG tablet Take 1 tablet (800 mg total) by mouth 3 (three) times daily.   hydrochlorothiazide  (MICROZIDE ) 12.5 MG capsule Take 1 capsule (12.5 mg total) by mouth daily.   levETIRAcetam  (KEPPRA ) 500 MG tablet Take 1 tablet (500 mg total) by mouth 2 (two) times daily.   nadolol  (CORGARD ) 40 MG tablet Take 1 tablet (40 mg total) by mouth daily.   tamsulosin  (FLOMAX ) 0.4 MG CAPS capsule Take 1 capsule (0.4 mg total) by mouth daily after supper.   zolpidem  (AMBIEN ) 10 MG tablet Take 1 tablet (10 mg total)  by mouth at bedtime.   [DISCONTINUED] lisinopril  (ZESTRIL ) 40 MG tablet Take 1 tablet (40 mg total) by mouth daily.   No facility-administered medications prior to visit.    Review of Systems  Constitutional:  Negative for weight loss.  HENT: Negative.    Eyes: Negative.   Respiratory: Negative.    Cardiovascular: Negative.   Gastrointestinal: Negative.   Genitourinary: Negative.  Negative for frequency and urgency.  Musculoskeletal:  Positive for back pain.  Skin: Negative.   Neurological: Negative.  Negative for seizures.  Endo/Heme/Allergies: Negative.   Psychiatric/Behavioral: Negative.         Objective:   BP (!) 140/80   Pulse 67   Temp 97.9 F (36.6 C)   Ht 6' 1 (1.854 m)   Wt 250 lb 9.6 oz (113.7  kg)   SpO2 96%   BMI 33.06 kg/m   Vitals:   08/11/23 0814  BP: (!) 140/80  Pulse: 67  Temp: 97.9 F (36.6 C)  Height: 6' 1 (1.854 m)  Weight: 250 lb 9.6 oz (113.7 kg)  SpO2: 96%  BMI (Calculated): 33.07    Physical Exam Vitals reviewed.  Constitutional:      Appearance: Normal appearance.  HENT:     Head: Normocephalic.     Left Ear: There is no impacted cerumen.     Nose: Nose normal.     Mouth/Throat:     Mouth: Mucous membranes are moist.     Pharynx: No posterior oropharyngeal erythema.  Eyes:     Extraocular Movements: Extraocular movements intact.     Pupils: Pupils are equal, round, and reactive to light.  Cardiovascular:     Rate and Rhythm: Regular rhythm.     Chest Wall: PMI is not displaced.     Pulses: Normal pulses.     Heart sounds: Normal heart sounds. No murmur heard. Pulmonary:     Effort: Pulmonary effort is normal.     Breath sounds: Normal air entry. No rhonchi or rales.  Abdominal:     General: Abdomen is flat. Bowel sounds are normal. There is no distension.     Palpations: Abdomen is soft. There is no hepatomegaly, splenomegaly or mass.     Tenderness: There is no abdominal tenderness.  Musculoskeletal:        General: Normal range of motion.     Cervical back: Normal range of motion and neck supple.     Right lower leg: Edema present.     Left lower leg: Edema present.  Skin:    General: Skin is warm and dry.  Neurological:     General: No focal deficit present.     Mental Status: He is alert and oriented to person, place, and time.     Cranial Nerves: No cranial nerve deficit.     Motor: No weakness.  Psychiatric:        Mood and Affect: Mood normal.        Behavior: Behavior normal.      Results for orders placed or performed in visit on 08/11/23  POCT Glucose (CBG)  Result Value Ref Range   POC Glucose 118 (A) 70 - 99 mg/dl    Recent Results (from the past 2160 hours)  Comprehensive metabolic panel     Status: Abnormal    Collection Time: 08/05/23  8:18 AM  Result Value Ref Range   Glucose 105 (H) 70 - 99 mg/dL   BUN 13 8 - 27 mg/dL   Creatinine, Ser 8.32 (  H) 0.76 - 1.27 mg/dL   eGFR 42 (L) >40 fO/fpw/8.26   BUN/Creatinine Ratio 8 (L) 10 - 24   Sodium 144 134 - 144 mmol/L   Potassium 4.7 3.5 - 5.2 mmol/L   Chloride 105 96 - 106 mmol/L   CO2 25 20 - 29 mmol/L   Calcium  9.4 8.6 - 10.2 mg/dL   Total Protein 6.5 6.0 - 8.5 g/dL   Albumin 4.2 3.8 - 4.8 g/dL   Globulin, Total 2.3 1.5 - 4.5 g/dL   Bilirubin Total 0.5 0.0 - 1.2 mg/dL   Alkaline Phosphatase 70 44 - 121 IU/L   AST 26 0 - 40 IU/L   ALT 17 0 - 44 IU/L  Lipid panel     Status: None   Collection Time: 08/05/23  8:18 AM  Result Value Ref Range   Cholesterol, Total 136 100 - 199 mg/dL   Triglycerides 894 0 - 149 mg/dL   HDL 46 >60 mg/dL   VLDL Cholesterol Cal 19 5 - 40 mg/dL   LDL Chol Calc (NIH) 71 0 - 99 mg/dL   Chol/HDL Ratio 3.0 0.0 - 5.0 ratio    Comment:                                   T. Chol/HDL Ratio                                             Men  Women                               1/2 Avg.Risk  3.4    3.3                                   Avg.Risk  5.0    4.4                                2X Avg.Risk  9.6    7.1                                3X Avg.Risk 23.4   11.0   Hemoglobin A1c     Status: Abnormal   Collection Time: 08/05/23  8:18 AM  Result Value Ref Range   Hgb A1c MFr Bld 6.4 (H) 4.8 - 5.6 %    Comment:          Prediabetes: 5.7 - 6.4          Diabetes: >6.4          Glycemic control for adults with diabetes: <7.0    Est. average glucose Bld gHb Est-mCnc 137 mg/dL  POCT Glucose (CBG)     Status: Abnormal   Collection Time: 08/11/23  8:28 AM  Result Value Ref Range   POC Glucose 118 (A) 70 - 99 mg/dl      Assessment & Plan:  Replace lisinopril  with olmesartan .Diabetic foot examination performed with confirmation of peripheral neuropathy and plantar callus at first tarsal. 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   Relevant Medications   olmesartan  (BENICAR ) 40 MG tablet   Other Relevant Orders   POCT Glucose (CBG) (Completed)   POC CREATINE & ALBUMIN,URINE   PR CUSTOM SHOES DEPTH INLAY   Hemoglobin A1c   Diabetic polyneuropathy (HCC)   Relevant Medications   olmesartan  (BENICAR ) 40 MG tablet     Genitourinary   Stage 3a chronic kidney disease (HCC)   Relevant Orders   Comprehensive metabolic panel with GFR     Other   Hyperlipidemia, unspecified   Relevant Medications   olmesartan  (BENICAR ) 40 MG tablet   Other Relevant Orders   Lipid panel    Return in about 6 weeks (around 09/22/2023) for BP followup.   Total time spent: 20 minutes  Sherrill Cinderella Perry, MD  08/11/2023   This document may have been prepared by [facility] Voice Recognition software and as such may include unintentional dictation errors.

## 2023-08-25 ENCOUNTER — Telehealth: Payer: Self-pay | Admitting: Internal Medicine

## 2023-08-25 NOTE — Telephone Encounter (Signed)
 Patient's wife left VM regarding the forms that were sent to [facility] [employer] for their diabetic shoes. [employer] called her yesterday and told her that the form needs to show that they had formal foot exams and needs to be checked that they have one or more of the qualifications.

## 2023-08-31 NOTE — Telephone Encounter (Signed)
 Faxed back.

## 2023-09-15 ENCOUNTER — Telehealth: Payer: Self-pay | Admitting: Internal Medicine

## 2023-09-15 NOTE — Telephone Encounter (Signed)
 [employer] left VM that they need it documented in the last office note that the patient has had a diabetic foot exam and has one of the qualifying conditions for his diabetic shoes. The order we sent back was not enough info for the insurance company. Please add this to the last note and let us  know when it is done so we can send it to [employer].

## 2023-09-20 NOTE — Telephone Encounter (Signed)
 faxed

## 2023-09-22 ENCOUNTER — Ambulatory Visit: Admitting: Internal Medicine

## 2023-09-22 ENCOUNTER — Encounter: Payer: Self-pay | Admitting: Internal Medicine

## 2023-09-22 VITALS — BP 136/76 | HR 71 | Ht 73.0 in | Wt 238.8 lb

## 2023-09-22 DIAGNOSIS — M6089 Other myositis, multiple sites: Secondary | ICD-10-CM

## 2023-09-22 DIAGNOSIS — R270 Ataxia, unspecified: Secondary | ICD-10-CM | POA: Insufficient documentation

## 2023-09-22 DIAGNOSIS — E118 Type 2 diabetes mellitus with unspecified complications: Secondary | ICD-10-CM | POA: Diagnosis not present

## 2023-09-22 DIAGNOSIS — I10 Essential (primary) hypertension: Secondary | ICD-10-CM | POA: Diagnosis not present

## 2023-09-22 LAB — POCT CBG (FASTING - GLUCOSE)-MANUAL ENTRY: Glucose Fasting, POC: 120 mg/dL — AB (ref 70–99)

## 2023-09-22 NOTE — Progress Notes (Signed)
 Established Patient Office Visit  Subjective:  Patient ID: Ian Gibson, male    DOB: 1945/07/07  Age: 78 y.o. MRN: [id number]  Chief Complaint  Patient presents with   Follow-up    6 week follow up    C/o worsening numbness of both feet and weakness of both legs after he twisted his back twice over the last two weeks. Denies sphincteric incontinence while BP has improved however lost 12 lbs since his last visit.    No other concerns at this time.   Past Medical History:  Diagnosis Date   CKD (chronic kidney disease)    Hepatic cirrhosis due to chronic hepatitis C infection (HCC) 2015   Treated with Harvoni .  cured   Hypertension    Seizure (HCC)    on Keppra .  No seizures in many years.    Past Surgical History:  Procedure Laterality Date   CATARACT EXTRACTION W/PHACO Right 09/16/2021   Procedure:  7.44 00:54.4;  Surgeon: Clair Crews, MD;  Location[facility];  Service: Ophthalmology;  Laterality: Right;   COLONOSCOPY     COLONOSCOPY WITH PROPOFOL  N/A 03/18/2023   Procedure: COLONOSCOPY WITH PROPOFOL ;  Surgeon: Marnee Sink, MD;  Location: [facility];  Service: Endoscopy;  Laterality: N/A;   ESOPHAGOGASTRODUODENOSCOPY     POLYPECTOMY  03/18/2023   Procedure: POLYPECTOMY;  Surgeon: Marnee Sink, MD;  Location: [facility];  Service: Endoscopy;;    Social History   Socioeconomic History   Marital status: Married    Spouse name: Not on file   Number of children: Not on file   Years of education: Not on file   Highest education level: Not on file  Occupational History   Not on file  Tobacco Use   Smoking status: Never   Smokeless tobacco: Never  Vaping Use   Vaping status: Never Used  Substance and Sexual Activity   Alcohol use: Never   Drug use: Never   Sexual activity: Not on file  Other Topics Concern   Not on file  Social History Narrative   Not on file   Social Drivers of Health   Financial Resource Strain: Not on file   Food Insecurity: Not on file  Transportation Needs: Not on file  Physical Activity: Not on file  Stress: Not on file  Social Connections: Not on file  Intimate Partner Violence: Not on file    No family history on file.  No Known Allergies  Outpatient Medications Prior to Visit  Medication Sig   aspirin EC 81 MG tablet Take by mouth.   atorvastatin  (LIPITOR) 40 MG tablet Take 1 tablet (40 mg total) by mouth daily.   cetirizine  (ZYRTEC ) 10 MG tablet Take 1 tablet (10 mg total) by mouth every morning.   clobetasol cream (TEMOVATE) 0.05 % APP THIN FILM AA ON BACK BID UNTIL CLEAR   gabapentin  (NEURONTIN ) 800 MG tablet Take 1 tablet (800 mg total) by mouth 3 (three) times daily.   hydrochlorothiazide  (MICROZIDE ) 12.5 MG capsule Take 1 capsule (12.5 mg total) by mouth daily.   levETIRAcetam  (KEPPRA ) 500 MG tablet Take 1 tablet (500 mg total) by mouth 2 (two) times daily.   nadolol  (CORGARD ) 40 MG tablet Take 1 tablet (40 mg total) by mouth daily.   olmesartan  (BENICAR ) 40 MG tablet Take 1 tablet (40 mg total) by mouth daily.   tamsulosin  (FLOMAX ) 0.4 MG CAPS capsule Take 1 capsule (0.4 mg total) by mouth daily after supper.   zolpidem  (AMBIEN ) 10  MG tablet Take 1 tablet (10 mg total) by mouth at bedtime.   No facility-administered medications prior to visit.    Review of Systems  Constitutional:  Positive for weight loss (12 lbs).  Gastrointestinal:        Poor appetite  Musculoskeletal:        As in hpi  All other systems reviewed and are negative.      Objective:   BP 136/76   Pulse 71   Ht 6' 1 (1.854 m)   Wt 238 lb 12.8 oz (108.3 kg)   SpO2 96%   BMI 31.51 kg/m   Vitals:   09/22/23 0830  BP: 136/76  Pulse: 71  Height: 6' 1 (1.854 m)  Weight: 238 lb 12.8 oz (108.3 kg)  SpO2: 96%  BMI (Calculated): 31.51    Physical Exam Vitals reviewed.  Constitutional:      Appearance: Normal appearance.  HENT:     Head: Normocephalic.     Left Ear: There is no  impacted cerumen.     Nose: Nose normal.     Mouth/Throat:     Mouth: Mucous membranes are moist.     Pharynx: No posterior oropharyngeal erythema.   Eyes:     Extraocular Movements: Extraocular movements intact.     Pupils: Pupils are equal, round, and reactive to light.    Cardiovascular:     Rate and Rhythm: Regular rhythm.     Chest Wall: PMI is not displaced.     Pulses: Normal pulses.     Heart sounds: Normal heart sounds. No murmur heard. Pulmonary:     Effort: Pulmonary effort is normal.     Breath sounds: Normal air entry. No rhonchi or rales.  Abdominal:     General: Abdomen is flat. Bowel sounds are normal. There is no distension.     Palpations: Abdomen is soft. There is no hepatomegaly, splenomegaly or mass.     Tenderness: There is no abdominal tenderness.   Musculoskeletal:        General: Normal range of motion.     Cervical back: Normal range of motion and neck supple.     Right lower leg: Edema present.     Left lower leg: Edema present.   Skin:    General: Skin is warm and dry.   Neurological:     General: No focal deficit present.     Mental Status: He is alert and oriented to person, place, and time.     Cranial Nerves: No cranial nerve deficit.     Motor: No weakness.     Coordination: Coordination abnormal.     Gait: Gait abnormal (tightrope, heel and tip toe. Wide based).   Psychiatric:        Mood and Affect: Mood normal.        Behavior: Behavior normal.      Results for orders placed or performed in visit on 09/22/23  POCT CBG (Fasting - Glucose)  Result Value Ref Range   Glucose Fasting, POC 120 (A) 70 - 99 mg/dL    Recent Results (from the past 2160 hours)  Comprehensive metabolic panel     Status: Abnormal   Collection Time: 08/05/23  8:18 AM  Result Value Ref Range   Glucose 105 (H) 70 - 99 mg/dL   BUN 13 8 - 27 mg/dL   Creatinine, Ser 1.61 (H) 0.76 - 1.27 mg/dL   eGFR 42 (L) >09 UE/AVW/0.98   BUN/Creatinine Ratio 8 (L) 10 -  24   Sodium 144 134 - 144 mmol/L   Potassium 4.7 3.5 - 5.2 mmol/L   Chloride 105 96 - 106 mmol/L   CO2 25 20 - 29 mmol/L   Calcium  9.4 8.6 - 10.2 mg/dL   Total Protein 6.5 6.0 - 8.5 g/dL   Albumin 4.2 3.8 - 4.8 g/dL   Globulin, Total 2.3 1.5 - 4.5 g/dL   Bilirubin Total 0.5 0.0 - 1.2 mg/dL   Alkaline Phosphatase 70 44 - 121 IU/L   AST 26 0 - 40 IU/L   ALT 17 0 - 44 IU/L  Lipid panel     Status: None   Collection Time: 08/05/23  8:18 AM  Result Value Ref Range   Cholesterol, Total 136 100 - 199 mg/dL   Triglycerides 409 0 - 149 mg/dL   HDL 46 >81 mg/dL   VLDL Cholesterol Cal 19 5 - 40 mg/dL   LDL Chol Calc (NIH) 71 0 - 99 mg/dL   Chol/HDL Ratio 3.0 0.0 - 5.0 ratio    Comment:                                   T. Chol/HDL Ratio                                             Men  Women                               1/2 Avg.Risk  3.4    3.3                                   Avg.Risk  5.0    4.4                                2X Avg.Risk  9.6    7.1                                3X Avg.Risk 23.4   11.0   Hemoglobin A1c     Status: Abnormal   Collection Time: 08/05/23  8:18 AM  Result Value Ref Range   Hgb A1c MFr Bld 6.4 (H) 4.8 - 5.6 %    Comment:          Prediabetes: 5.7 - 6.4          Diabetes: >6.4          Glycemic control for adults with diabetes: <7.0    Est. 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   Total time spent: 30 minutes  Arzella Bitters, MD  09/22/2023   This document may have been prepared by [facility] Voice Recognition software and as such may include unintentional dictation errors.

## 2023-09-22 NOTE — Telephone Encounter (Signed)
 Spoke with [employer] and the note needs to be addended to say that the patient has calluses and where they are located on the foot. Please add that to the note and then I will fax it again to [employer].

## 2023-09-23 LAB — HEPATIC FUNCTION PANEL
ALT: 17 IU/L (ref 0–44)
AST: 23 IU/L (ref 0–40)
Albumin: 4.5 g/dL (ref 3.8–4.8)
Alkaline Phosphatase: 68 IU/L (ref 44–121)
Bilirubin Total: 0.5 mg/dL (ref 0.0–1.2)
Bilirubin, Direct: 0.2 mg/dL (ref 0.00–0.40)
Total Protein: 6.8 g/dL (ref 6.0–8.5)

## 2023-09-23 LAB — SEDIMENTATION RATE: Sed Rate: 2 mm/h (ref 0–30)

## 2023-09-23 LAB — ALDOLASE: Aldolase: 4.9 U/L (ref 3.3–10.3)

## 2023-09-24 ENCOUNTER — Telehealth: Payer: Self-pay | Admitting: Internal Medicine

## 2023-09-24 NOTE — Telephone Encounter (Signed)
 [facility] [employer] called in regards to pt progress notes, states that the notes need to state neuropathy with callus  Fax # [phone]

## 2023-09-27 ENCOUNTER — Telehealth: Payer: Self-pay

## 2023-09-27 NOTE — Telephone Encounter (Signed)
 Patient wife called about the office note being amended again to state that he's got  neuropathy with callus  so he can get this diabetic shoes please

## 2023-09-28 ENCOUNTER — Ambulatory Visit
Admission: RE | Admit: 2023-09-28 | Discharge: 2023-09-28 | Disposition: A | Discharge status: Home / Self Care | Source: Ambulatory Visit | Attending: Internal Medicine | Admitting: Internal Medicine

## 2023-09-28 DIAGNOSIS — R270 Ataxia, unspecified: Secondary | ICD-10-CM

## 2023-09-28 NOTE — Telephone Encounter (Signed)
 This has been faxed again, patient wife informed

## 2023-09-29 ENCOUNTER — Ambulatory Visit: Payer: Self-pay | Admitting: Internal Medicine

## 2023-09-29 DIAGNOSIS — M5416 Radiculopathy, lumbar region: Secondary | ICD-10-CM

## 2023-10-04 NOTE — Progress Notes (Signed)
Left a message informing patient to call back

## 2023-10-05 NOTE — Progress Notes (Signed)
 Called patient to inform that [facility] neurosurgery is located in [place].

## 2023-10-06 ENCOUNTER — Other Ambulatory Visit: Payer: Self-pay | Admitting: *Deleted

## 2023-10-06 ENCOUNTER — Encounter: Payer: Self-pay | Admitting: Internal Medicine

## 2023-10-06 ENCOUNTER — Ambulatory Visit: Admitting: Internal Medicine

## 2023-10-06 VITALS — BP 120/70 | HR 64 | Temp 96.1°F | Ht 73.0 in | Wt 243.8 lb

## 2023-10-06 DIAGNOSIS — E118 Type 2 diabetes mellitus with unspecified complications: Secondary | ICD-10-CM

## 2023-10-06 DIAGNOSIS — E782 Mixed hyperlipidemia: Secondary | ICD-10-CM | POA: Diagnosis not present

## 2023-10-06 DIAGNOSIS — M6089 Other myositis, multiple sites: Secondary | ICD-10-CM

## 2023-10-06 DIAGNOSIS — C61 Malignant neoplasm of prostate: Secondary | ICD-10-CM

## 2023-10-06 DIAGNOSIS — N1831 Chronic kidney disease, stage 3a: Secondary | ICD-10-CM

## 2023-10-06 DIAGNOSIS — M5416 Radiculopathy, lumbar region: Secondary | ICD-10-CM

## 2023-10-06 DIAGNOSIS — E119 Type 2 diabetes mellitus without complications: Secondary | ICD-10-CM

## 2023-10-06 LAB — GLUCOSE, POCT (MANUAL RESULT ENTRY): POC Glucose: 106 mg/dL — AB (ref 70–99)

## 2023-10-06 NOTE — Progress Notes (Signed)
 Established Patient Office Visit  Subjective:  Patient ID: Ian Gibson, male    DOB: November 10, 1945  Age: 78 y.o. MRN: [id number]  Chief Complaint  Patient presents with   Follow-up    2 weeks for gait difficulties    No new complaints, here for gait follow up. Labs reviewed and unremarkable while mri notable for lumbar spinal dx and awaits neurosurgical eval in [place].    No other concerns at this time.   Past Medical History:  Diagnosis Date   CKD (chronic kidney disease)    Hepatic cirrhosis due to chronic hepatitis C infection (HCC) 2015   Treated with Harvoni .  cured   Hypertension    Seizure (HCC)    on Keppra .  No seizures in many years.    Past Surgical History:  Procedure Laterality Date   CATARACT EXTRACTION W/PHACO Right 09/16/2021   Procedure:  7.44 00:54.4;  Surgeon: Jaye Fallow, MD;  Location: [facility];  Service: Ophthalmology;  Laterality: Right;   COLONOSCOPY     COLONOSCOPY WITH PROPOFOL  N/A 03/18/2023   Procedure: COLONOSCOPY WITH PROPOFOL ;  Surgeon: Jinny Carmine, MD;  Location: [facility];  Service: Endoscopy;  Laterality: N/A;   ESOPHAGOGASTRODUODENOSCOPY     POLYPECTOMY  03/18/2023   Procedure: POLYPECTOMY;  Surgeon: Jinny Carmine, MD;  Location: [facility] END[facility]COPY;  Service: Endoscopy;;    Social History   Socioeconomic History   Marital status: Married    Spouse name: Not on file   Number of children: Not on file   Years of education: Not on file   Highest education level: Not on file  Occupational History   Not on file  Tobacco Use   Smoking status: Never   Smokeless tobacco: Never  Vaping Use   Vaping status: Never Used  Substance and Sexual Activity   Alcohol use: Never   Drug use: Never   Sexual activity: Not on file  Other Topics Concern   Not on file  Social History Narrative   Not on file   Social Drivers of Health   Financial Resource Strain: Not on file  Food Insecurity: Not on file   Transportation Needs: Not on file  Physical Activity: Not on file  Stress: Not on file  Social Connections: Not on file  Intimate Partner Violence: Not on file    No family history on file.  No Known Allergies  Outpatient Medications Prior to Visit  Medication Sig   aspirin EC 81 MG tablet Take by mouth.   atorvastatin  (LIPITOR) 40 MG tablet Take 1 tablet (40 mg total) by mouth daily.   cetirizine  (ZYRTEC ) 10 MG tablet Take 1 tablet (10 mg total) by mouth every morning.   clobetasol cream (TEMOVATE) 0.05 % APP THIN FILM AA ON BACK BID UNTIL CLEAR   gabapentin  (NEURONTIN ) 800 MG tablet Take 1 tablet (800 mg total) by mouth 3 (three) times daily.   hydrochlorothiazide  (MICROZIDE ) 12.5 MG capsule Take 1 capsule (12.5 mg total) by mouth daily.   levETIRAcetam  (KEPPRA ) 500 MG tablet Take 1 tablet (500 mg total) by mouth 2 (two) times daily.   nadolol  (CORGARD ) 40 MG tablet Take 1 tablet (40 mg total) by mouth daily.   olmesartan  (BENICAR ) 40 MG tablet Take 1 tablet (40 mg total) by mouth daily.   tamsulosin  (FLOMAX ) 0.4 MG CAPS capsule Take 1 capsule (0.4 mg total) by mouth daily after supper.   zolpidem  (AMBIEN ) 10 MG tablet Take 1 tablet (10 mg total) by mouth at  bedtime.   No facility-administered medications prior to visit.    Review of Systems  Constitutional:  Negative for weight loss.  HENT: Negative.    Eyes: Negative.   Respiratory: Negative.    Cardiovascular: Negative.   Gastrointestinal: Negative.   Genitourinary: Negative.  Negative for frequency and urgency.  Musculoskeletal:  Positive for back pain.  Skin: Negative.   Neurological: Negative.  Negative for seizures.  Endo/Heme/Allergies: Negative.   Psychiatric/Behavioral: Negative.         Objective:   BP 120/70   Pulse 64   Temp (!) 96.1 F (35.6 C)   Ht 6' 1 (1.854 m)   Wt 243 lb 12.8 oz (110.6 kg)   SpO2 93%   BMI 32.17 kg/m   Vitals:   10/06/23 0858  BP: 120/70  Pulse: 64  Temp: (!) 96.1  F (35.6 C)  Height: 6' 1 (1.854 m)  Weight: 243 lb 12.8 oz (110.6 kg)  SpO2: 93%  BMI (Calculated): 32.17    Physical Exam Vitals reviewed.  Constitutional:      Appearance: Normal appearance.  HENT:     Head: Normocephalic.     Left Ear: There is no impacted cerumen.     Nose: Nose normal.     Mouth/Throat:     Mouth: Mucous membranes are moist.     Pharynx: No posterior oropharyngeal erythema.  Eyes:     Extraocular Movements: Extraocular movements intact.     Pupils: Pupils are equal, round, and reactive to light.  Cardiovascular:     Rate and Rhythm: Regular rhythm.     Chest Wall: PMI is not displaced.     Pulses: Normal pulses.     Heart sounds: Normal heart sounds. No murmur heard. Pulmonary:     Effort: Pulmonary effort is normal.     Breath sounds: Normal air entry. No rhonchi or rales.  Abdominal:     General: Abdomen is flat. Bowel sounds are normal. There is no distension.     Palpations: Abdomen is soft. There is no hepatomegaly, splenomegaly or mass.     Tenderness: There is no abdominal tenderness.  Musculoskeletal:        General: Normal range of motion.     Cervical back: Normal range of motion and neck supple.     Right lower leg: Edema present.     Left lower leg: Edema present.  Skin:    General: Skin is warm and dry.  Neurological:     General: No focal deficit present.     Mental Status: He is alert and oriented to person, place, and time.     Cranial Nerves: No cranial nerve deficit.     Motor: No weakness.     Coordination: Coordination abnormal.     Gait: Gait abnormal (tightrope, heel and tip toe. Wide based).  Psychiatric:        Mood and Affect: Mood normal.        Behavior: Behavior normal.      Results for orders placed or performed in visit on 10/06/23  POCT Glucose (CBG)  Result Value Ref Range   POC Glucose 106 (A) 70 - 99 mg/dl    Recent Results (from the past 2160 hours)  Comprehensive metabolic panel     Status:  Abnormal   Collection Time: 08/05/23  8:18 AM  Result Value Ref Range   Glucose 105 (H) 70 - 99 mg/dL   BUN 13 8 - 27 mg/dL   Creatinine, Ser  1.67 (H) 0.76 - 1.27 mg/dL   eGFR 42 (L) >40 fO/fpw/8.26   BUN/Creatinine Ratio 8 (L) 10 - 24   Sodium 144 134 - 144 mmol/L   Potassium 4.7 3.5 - 5.2 mmol/L   Chloride 105 96 - 106 mmol/L   CO2 25 20 - 29 mmol/L   Calcium  9.4 8.6 - 10.2 mg/dL   Total Protein 6.5 6.0 - 8.5 g/dL   Albumin 4.2 3.8 - 4.8 g/dL   Globulin, Total 2.3 1.5 - 4.5 g/dL   Bilirubin Total 0.5 0.0 - 1.2 mg/dL   Alkaline Phosphatase 70 44 - 121 IU/L   AST 26 0 - 40 IU/L   ALT 17 0 - 44 IU/L  Lipid panel     Status: None   Collection Time: 08/05/23  8:18 AM  Result Value Ref Range   Cholesterol, Total 136 100 - 199 mg/dL   Triglycerides 894 0 - 149 mg/dL   HDL 46 >60 mg/dL   VLDL Cholesterol Cal 19 5 - 40 mg/dL   LDL Chol Calc (NIH) 71 0 - 99 mg/dL   Chol/HDL Ratio 3.0 0.0 - 5.0 ratio    Comment:                                   T. Chol/HDL Ratio                                             Men  Women                               1/2 Avg.Risk  3.4    3.3                                   Avg.Risk  5.0    4.4                                2X Avg.Risk  9.6    7.1                                3X Avg.Risk 23.4   11.0   Hemoglobin A1c     Status: Abnormal   Collection Time: 08/05/23  8:18 AM  Result Value Ref Range   Hgb A1c MFr Bld 6.4 (H) 4.8 - 5.6 %    Comment:          Prediabetes: 5.7 - 6.4          Diabetes: >6.4          Glycemic control for adults with diabetes: <7.0    Est. average glucose Bld gHb Est-mCnc 137 mg/dL  POCT Glucose (CBG)     Status: Abnormal   Collection Time: 08/11/23  8:28 AM  Result Value Ref Range   POC Glucose 118 (A) 70 - 99 mg/dl  POCT CBG (Fasting - Glucose)     Status: Abnormal   Collection Time: 09/22/23  8:34 AM  Result Value Ref Range   Glucose Fasting, POC 120 (A) 70 - 99 mg/dL  Aldolase     Status: None   Collection  Time: 09/22/23  9:20 AM  Result Value Ref Range   Aldolase 4.9 3.3 - 10.3 U/L  Hepatic function panel     Status: None   Collection Time: 09/22/23  9:20 AM  Result Value Ref Range   Total Protein 6.8 6.0 - 8.5 g/dL   Albumin 4.5 3.8 - 4.8 g/dL   Bilirubin Total 0.5 0.0 - 1.2 mg/dL   Bilirubin, Direct 9.79 0.00 - 0.40 mg/dL   Alkaline Phosphatase 68 44 - 121 IU/L   AST 23 0 - 40 IU/L   ALT 17 0 - 44 IU/L  Sed Rate (ESR)     Status: None   Collection Time: 09/22/23  9:20 AM  Result Value Ref Range   Sed Rate 2 0 - 30 mm/hr  POCT Glucose (CBG)     Status: Abnormal   Collection Time: 10/06/23  9:04 AM  Result Value Ref Range   POC Glucose 106 (A) 70 - 99 mg/dl      Assessment & Plan:  As per problem list. Fu with Neurosurgery.  Problem List Items Addressed This Visit       Endocrine   Diabetes mellitus type 2 with complications (HCC)     Genitourinary   Stage 3a chronic kidney disease (HCC)     Other   Hyperlipidemia, unspecified   Other Visit Diagnoses       Type 2 diabetes mellitus without complication, without long-term current use of insulin (HCC)    -  Primary   Relevant Orders   POCT Glucose (CBG) (Completed)     Lumbar nerve root impingement         Other myositis of multiple sites           Return if symptoms worsen or fail to improve, for fu with labs prior. Keep appt.   Total time spent: 20 minutes  Sherrill Cinderella Perry, MD  10/06/2023   This document may have been prepared by [facility] Voice Recognition software and as such may include unintentional dictation errors.

## 2023-10-06 NOTE — Progress Notes (Signed)
 psa

## 2023-10-12 ENCOUNTER — Telehealth: Payer: Self-pay

## 2023-10-12 NOTE — Telephone Encounter (Signed)
Pt informed

## 2023-10-12 NOTE — Telephone Encounter (Signed)
 Pt called saying he had an MRI and a CT and was requesting to be sent the results as they were told they had been sent to you.

## 2023-10-13 ENCOUNTER — Inpatient Hospital Stay: Payer: Medicare Other | Attending: Radiation Oncology

## 2023-10-13 DIAGNOSIS — C61 Malignant neoplasm of prostate: Secondary | ICD-10-CM | POA: Insufficient documentation

## 2023-10-13 LAB — PSA: Prostatic Specific Antigen: 0.21 ng/mL (ref 0.00–4.00)

## 2023-10-14 NOTE — Progress Notes (Unsigned)
 Referring Physician:  Albina GORMAN Dine, MD [address] [place],  [place] [address]  Primary Physician:  Albina GORMAN Dine, MD  History of Present Illness: 10/20/2023 Ian Gibson is here today with a chief complaint of low back pain radiating into his bilateral thighs down to his legs.  He feels that he has worsening difficulty with his walking.  He is not able to walk any significant distance.  He has to sit down and lean forward to get any relief.  He does not have any history of vascular claudication.  He does have a history of kidney disease.  He has noticed that his legs have been feeling heavier and getting worse.  He also has back pain that is midline and sometimes radiates outward.  Does not have any bowel or bladder discomfort.  No evidence of myelopathy on his clinical history.  I have utilized the care everywhere function in epic to review the outside records available from external health systems.  Review of Systems:  A 10 point review of systems is negative, except for the pertinent positives and negatives detailed in the HPI.  Past Medical History: Past Medical History:  Diagnosis Date   CKD (chronic kidney disease)    Hepatic cirrhosis due to chronic hepatitis C infection (HCC) 2015   Treated with Harvoni .  cured   Hypertension    Seizure (HCC)    on Keppra .  No seizures in many years.    Past Surgical History: Past Surgical History:  Procedure Laterality Date   CATARACT EXTRACTION W/PHACO Right 09/16/2021   Procedure:  7.44 00:54.4;  Surgeon: Jaye Fallow, MD;  Location: [facility];  Service: Ophthalmology;  Laterality: Right;   COLONOSCOPY     COLONOSCOPY WITH PROPOFOL  N/A 03/18/2023   Procedure: COLONOSCOPY WITH PROPOFOL ;  Surgeon: Jinny Carmine, MD;  Location: [facility];  Service: Endoscopy;  Laterality: N/A;   ESOPHAGOGASTRODUODENOSCOPY     POLYPECTOMY  03/18/2023   Procedure: POLYPECTOMY;  Surgeon: Jinny Carmine, MD;   Location[facility];  Service: Endoscopy;;    Allergies: Allergies as of 10/20/2023   (No Known Allergies)    Medications:  Current Outpatient Medications:    aspirin EC 81 MG tablet, Take by mouth., Disp: , Rfl:    atorvastatin  (LIPITOR) 40 MG tablet, Take 1 tablet (40 mg total) by mouth daily., Disp: 90 tablet, Rfl: 1   cetirizine  (ZYRTEC ) 10 MG tablet, Take 1 tablet (10 mg total) by mouth every morning., Disp: 90 tablet, Rfl: 1   clobetasol cream (TEMOVATE) 0.05 %, APP THIN FILM AA ON BACK BID UNTIL CLEAR, Disp: , Rfl: 2   gabapentin  (NEURONTIN ) 800 MG tablet, Take 1 tablet (800 mg total) by mouth 3 (three) times daily., Disp: 270 tablet, Rfl: 1   hydrochlorothiazide  (MICROZIDE ) 12.5 MG capsule, Take 1 capsule (12.5 mg total) by mouth daily., Disp: 90 capsule, Rfl: 1   levETIRAcetam  (KEPPRA ) 500 MG tablet, Take 1 tablet (500 mg total) by mouth 2 (two) times daily., Disp: 180 tablet, Rfl: 1   nadolol  (CORGARD ) 40 MG tablet, Take 1 tablet (40 mg total) by mouth daily., Disp: 30 tablet, Rfl: 11   olmesartan  (BENICAR ) 40 MG tablet, Take 1 tablet (40 mg total) by mouth daily., Disp: 90 tablet, Rfl: 0   tamsulosin  (FLOMAX ) 0.4 MG CAPS capsule, Take 1 capsule (0.4 mg total) by mouth daily after supper., Disp: 90 capsule, Rfl: 4   zolpidem  (AMBIEN ) 10 MG tablet, Take 1 tablet (10 mg total)  by mouth at bedtime., Disp: 90 tablet, Rfl: 0  Social History: Social History   Tobacco Use   Smoking status: Never   Smokeless tobacco: Never  Vaping Use   Vaping status: Never Used  Substance Use Topics   Alcohol use: Never   Drug use: Never    Family Medical History: No family history on file.  Physical Examination:  NEUROLOGICAL:   Strength:  Side Iliopsoas Quads Hamstring PF DF EHL  R 5 5 5 5 5 5   L 5 5 5 5 5 5    Reflexes are 1+ in the bilateral patella and Achilles  No evidence of dysmetria noted.  Gait is antalgic with forward carriage,  DP and PT pulses are felt  bilaterally.  Imaging: Narrative & Impression  CLINICAL DATA:  Worsening numbness of both feet in weakness in the legs. Twisting back injuries in the past. History of prostate cancer.   EXAM: MRI LUMBAR SPINE WITHOUT CONTRAST   TECHNIQUE: Multiplanar, multisequence MR imaging of the lumbar spine was performed. No intravenous contrast was administered.   COMPARISON:  Multiple exams, including bone scan 03/24/2022 and CT abdomen from 02/20/2015   FINDINGS: Segmentation: The lowest lumbar type non-rib-bearing vertebra is labeled as L5.   Alignment: 3 mm degenerative anterolisthesis at L2-3 and 2 mm degenerative anterolisthesis at L3-4.   Vertebrae: Disc desiccation at all levels between L2 and L5 with loss of disc height at L3-4 and L4-5. Type 2 degenerative endplate findings at L3-4 and L4-5.   Low-level facet edema bilaterally at L2-3 and on the right at L5-S1.   Congenitally short pedicles in the lumbar spine.   Conus medullaris and cauda equina: Conus extends to the L1-2 level. Conus and cauda equina appear normal.   Paraspinal and other soft tissues: Unremarkable   Disc levels:   L1-2: No impingement. Bilateral degenerative facet arthropathy and mild disc bulge.   L2-3: Moderate to prominent central narrowing of the thecal sac with mild bilateral foraminal stenosis as well as moderate left and mild right subarticular lateral recess stenosis due to substantial degenerative facet arthropathy, ligamentum flavum redundancy, disc uncovering, and disc bulge as well as short pedicles.   L3-4: Prominent central narrowing of the thecal sac with mild bilateral foraminal stenosis, displacement of both L3 nerves in the lateral extraforaminal space, and moderate bilateral subarticular lateral recess stenosis due to substantial degenerative facet arthropathy, ligamentum flavum redundancy, and disc osteophyte complex.   L4-5: Mild bilateral subarticular lateral recess  stenosis due to disc bulge and facet arthropathy.   L5-S1: Mild bilateral foraminal stenosis and mild right subarticular lateral recess stenosis due to disc bulge and facet arthropathy.   IMPRESSION: 1. Lumbar spondylosis, congenitally short lumbar spine pedicles, and degenerative disc disease, causing prominent impingement at L3-4; moderate to prominent impingement at L2-3; mild impingement at L4-5 and L5-S1. 2. Low-level degenerative facet edema bilaterally at L2-3 and on the right at L5-S1.     Electronically Signed   By: Ryan Salvage M.D.   On: 09/28/2023 08:35   I have personally reviewed the images and agree with the above interpretation.  Medical Decision Making/Assessment and Plan: Ian Gibson is a pleasant 78 y.o. male with severe lumbar stenosis and neurogenic claudication..  He has noticed that he has had progressive worsening back pain and this radiating down into his lower extremities.  This causes him to have a significant amount of heaviness in his lower extremities.  This is been getting worse over the past year and  has progressed significantly over the past few months.  He states that his back pain and leg pain are about 50-50 in their distribution.  On physical examination he has good strength with slightly decreased reflexes bilaterally.  He does have an antalgic gait with forward carriage.  States that when he walks if he sits down and is able to lean forward that this improves his lower extremity aching.  Physical exam shows no signs of vascular claudication.  His imaging shows significant stenosis especially at L2-L4 secondary to disc space collapse, facet arthropathy, and disc osteophyte complexes.  There is more mild impingement distal to these levels.  I do feel that he is significantly positive for lumbar neurogenic claudication, I like him to see a physical therapist as well as get lumbar flexion-extension x-rays to evaluate for any evidence of instability.   Should he not have any instability it would be worth trialing a direct decompression to see if this will help with his lumbar claudication, however if he does have instability we would discuss possible spinal fusion.  I like to see him back in 3 months after conservative care has been trialed.  Thank you for involving me in the care of this patient.    Ian MICAEL Sharps MD/MSCR Neurosurgery

## 2023-10-20 ENCOUNTER — Encounter: Payer: Self-pay | Admitting: Radiation Oncology

## 2023-10-20 ENCOUNTER — Ambulatory Visit
Admission: RE | Admit: 2023-10-20 | Discharge: 2023-10-20 | Disposition: A | Discharge status: Home / Self Care | Payer: Medicare Other | Source: Ambulatory Visit | Attending: Radiation Oncology | Admitting: Radiation Oncology

## 2023-10-20 ENCOUNTER — Ambulatory Visit (INDEPENDENT_AMBULATORY_CARE_PROVIDER_SITE_OTHER): Admitting: Neurosurgery

## 2023-10-20 ENCOUNTER — Encounter: Payer: Self-pay | Admitting: Neurosurgery

## 2023-10-20 VITALS — BP 124/68 | Ht 73.0 in | Wt 242.0 lb

## 2023-10-20 VITALS — BP 156/62 | HR 50 | Temp 96.1°F | Resp 16 | Ht 73.0 in | Wt 242.9 lb

## 2023-10-20 DIAGNOSIS — Z923 Personal history of irradiation: Secondary | ICD-10-CM | POA: Diagnosis not present

## 2023-10-20 DIAGNOSIS — C61 Malignant neoplasm of prostate: Secondary | ICD-10-CM | POA: Insufficient documentation

## 2023-10-20 DIAGNOSIS — M48062 Spinal stenosis, lumbar region with neurogenic claudication: Secondary | ICD-10-CM | POA: Diagnosis not present

## 2023-10-20 NOTE — Progress Notes (Signed)
 Radiation Oncology Follow up Note  Name: Ian Gibson   Date:   10/20/2023 MRN:  [id number] DOB: October 11, 1945    This 78 y.o. male presents to the clinic today for 79-month follow-up.  Status post image guided IMRT radiation therapy for Gleason 7 (3+4) adenocarcinoma presenting with a PSA in the 16 range  REFERRING PROVIDER: Albina GORMAN Dine, MD  HPI: Patient is a 78 year old male now about 16 months having completed image-guided MRT radiation therapy for Gleason 7 adenocarcinoma the prostate.  Seen today in routine follow-up he is doing well.  Low side effect profile specifically denies any increased lower urinary tract symptoms diarrhea or fatigue his most recent PSA continues to decline down to 0.21 from 0.31 back in January.  COMPLICATIONS OF TREATMENT: none  FOLLOW UP COMPLIANCE: keeps appointments   PHYSICAL EXAM:  BP (!) 156/62   Pulse (!) 50   Temp (!) 96.1 F (35.6 C) (Tympanic)   Resp 16   Ht 6' 1 (1.854 m)   Wt 242 lb 14.4 oz (110.2 kg)   BMI 32.05 kg/m  Well-developed well-nourished patient in NAD. HEENT reveals PERLA, EOMI, discs not visualized.  Oral cavity is clear. No oral mucosal lesions are identified. Neck is clear without evidence of cervical or supraclavicular adenopathy. Lungs are clear to A&P. Cardiac examination is essentially unremarkable with regular rate and rhythm without murmur rub or thrill. Abdomen is benign with no organomegaly or masses noted. Motor sensory and DTR levels are equal and symmetric in the upper and lower extremities. Cranial nerves II through XII are grossly intact. Proprioception is intact. No peripheral adenopathy or edema is identified. No motor or sensory levels are noted. Crude visual fields are within normal range.  RADIOLOGY RESULTS: No current films to review  PLAN: Present time patient is under excellent biochemical control of his prostate cancer and pleased with his overall progress.  I have asked to see him back in 6 months  for follow-up and then we will start once year follow-up visits.  Patient knows to call at anytime with any concerns.  I would like to take this opportunity to thank you for allowing me to participate in the care of your patient.SABRA Marcey Penton, MD

## 2023-10-20 NOTE — Patient Instructions (Addendum)
 LOCAL PHYSICAL THERAPY  [facility]  [address]  [place], [place] [address]  [phone]   [facility]  [address] [place], [place] [address]  [phone]   [facility] (2 locations)  [address]  [place], [place] [address]  [phone]          or  [address]  [place], [place] [address]  [phone]   [facility]  [address]  [place], [place] [address]  [phone]  **dry needling**   [facility])  [address]  [place], [place] [address]  [phone]  Fax: [phone]  ** Aquatic therapy[address] [place], [place] [address] [phone] **Aquatic therapy**  [facility] [address] [place], [place] [address] [phone]  [facility]  [address]  [place], [place] [address]  [phone]   [facility]  [address]  [place], [place] [address]  [phone]   [facility]  [address]   Deland NOVAK  [place], [place] [address]  [phone]   [facility]  [address]  [place], [place] [address]  [phone]  **dry needling**   PELVIC FLOOR/SI JOINT  [facility]  Nidia Laud, PT  [email or web address]   [place]   [facility]  [address]  Suite [place], [place] [address]  [phone]   [facility] [address] [place], [place] [address] [phone]  [facility] [address] 2nd floor [place], [place] [address] [phone]  [facility] [address] [place], [place] [address] [phone]  [facility] [address] [place], [place]  [address] [phone]  [facility]  [address]  [place], [place] [address]  [phone]  [facility] [address] [place], [place] [address] [phone]  [facility]  [address] [place], [place] [address] [phone]   [facility] & Aquatic Therapy [address] [place], [place] [address] [phone]  [facility] [place]  [address] [place] [address]  [phone]   [facility] [address] [place], [place] [address]  [facility] [phone] [place], [place] [address] [phone]  [facility] [address] [place], [place] [address] [phone]  [facility] [address] [place], [place] [address]  [facility] [address] [place], [place] [address] [phone]  [facility] [address], [place] [address] [phone]  [facility] [address]  [place], [place] [phone]  [facility] [address] [place], [place] [address] [phone]  [facility] Physical Therapy & wellness [address] [place], [place] [address] [phone]  [facility] [address], [place] [address] [phone]  [facility], [place]   [facility]  Channing Dross, PT  [address]  [place], [place] [address] [phone]   CARIDAD LIEN   [facility]  [address]  [phone]   [facility]   [facility] [address] Physical Therapy  [address]  [phone]   CHIROPRACTORS   [facility]  [facility]  [address]  [place], [place] [address]  [phone]  **No Aetna or medicaid**   [facility]  [phone] [address] [place] [address]  [phone]   [facility]  [address]  [place], [place] [address]  [phone]   Todd Asa, [place]  [address], [place] [address]  [phone]   [facility]  [address] [place] [address]  [phone]   [facility]  [address]  [place], [place] [address]  [phone]  [facility]  (3 locations)  [address]  [place], [place] [address]  [phone]  **dry needling**           or  [address] [place], [place] [address]  [phone]  **Additionally has Deland Tedra Ip, OT**           or  [address]  [place], [place] [address]  [phone]  **Pediatric therapy**  [facility]  [address]  [phone]  **dry needlingTHORA Gerad[facility]  [address]  [place], [place] [address]  [phone]  [facility]   (Inside [address] Fitness)  [address]  [place], [place] [address]  [phone]  **dry needling**  **MEDICAID or UNINSURED** [facility] dept. Of Physical Therapy [place], [place] [address] [id number]  [facility]  [address] [place], [place] [address]  [phone]   [facility]  [address]  [place], [place] [address]  [phone]   [facility] Physical Therapy  [address] [place], [place] [address]  [phone]   [facility] Physical Therapy & Rehabilitation  [address]  [place], [place] [address]  [phone]   [facility]  [address] [place], [place] [address]  [phone]   [facility]  [address]  [place], [place] [address]  [phone]   [facility] [address] [place], [place] [address]  AQUATIC   [employer] [facility]  [facility]  [place]   [place]  *Residents only*  [employer] [facility] [employer]  *Residents  only*   [facility]  Exercise class  [facility]  Exercise class    [place], [place]  [facility]  [address]  [phone]   [facility]  [address]  [phone]           or  [address]  [phone]   [facility]   Provides education and support and practical information for coping with arthritis for arthritis sufferers and their families.   When: 12:15 - 1:30 p.m. the second Monday of each month, March through December  Info: Call Rehabilitation Services at [phone]

## 2023-11-05 ENCOUNTER — Other Ambulatory Visit: Payer: Self-pay | Admitting: Internal Medicine

## 2023-11-05 DIAGNOSIS — I10 Essential (primary) hypertension: Secondary | ICD-10-CM

## 2023-11-05 DIAGNOSIS — G40409 Other generalized epilepsy and epileptic syndromes, not intractable, without status epilepticus: Secondary | ICD-10-CM

## 2023-11-08 ENCOUNTER — Other Ambulatory Visit

## 2023-11-08 ENCOUNTER — Other Ambulatory Visit: Payer: Self-pay | Admitting: Internal Medicine

## 2023-11-09 LAB — LIPID PANEL
Chol/HDL Ratio: 3.4 ratio (ref 0.0–5.0)
Cholesterol, Total: 176 mg/dL (ref 100–199)
HDL: 52 mg/dL (ref >39)
LDL Chol Calc (NIH): 99 mg/dL (ref 0–99)
Triglycerides: 143 mg/dL (ref 0–149)
VLDL Cholesterol Cal: 25 mg/dL (ref 5–40)

## 2023-11-09 LAB — COMPREHENSIVE METABOLIC PANEL WITH GFR
ALT: 24 IU/L (ref 0–44)
AST: 30 IU/L (ref 0–40)
Albumin: 4.5 g/dL (ref 3.8–4.8)
Alkaline Phosphatase: 76 IU/L (ref 44–121)
BUN/Creatinine Ratio: 8 — ABNORMAL LOW (ref 10–24)
BUN: 12 mg/dL (ref 8–27)
Bilirubin Total: 0.5 mg/dL (ref 0.0–1.2)
CO2: 24 mmol/L (ref 20–29)
Calcium: 9.6 mg/dL (ref 8.6–10.2)
Chloride: 101 mmol/L (ref 96–106)
Creatinine, Ser: 1.52 mg/dL — ABNORMAL HIGH (ref 0.76–1.27)
Globulin, Total: 2.1 g/dL (ref 1.5–4.5)
Glucose: 101 mg/dL — ABNORMAL HIGH (ref 70–99)
Potassium: 4.8 mmol/L (ref 3.5–5.2)
Sodium: 142 mmol/L (ref 134–144)
Total Protein: 6.6 g/dL (ref 6.0–8.5)
eGFR: 47 mL/min/1.73 — ABNORMAL LOW (ref >59)

## 2023-11-09 LAB — HEMOGLOBIN A1C
Est. average glucose Bld gHb Est-mCnc: 123 mg/dL
Hgb A1c MFr Bld: 5.9 % — ABNORMAL HIGH (ref 4.8–5.6)

## 2023-11-09 LAB — CK: Total CK: 457 U/L — ABNORMAL HIGH (ref 41–331)

## 2023-11-17 ENCOUNTER — Encounter: Payer: Self-pay | Admitting: Internal Medicine

## 2023-11-17 ENCOUNTER — Ambulatory Visit: Payer: Self-pay | Admitting: Internal Medicine

## 2023-11-17 ENCOUNTER — Ambulatory Visit: Admitting: Internal Medicine

## 2023-11-17 VITALS — BP 122/80 | HR 71 | Temp 95.5°F | Ht 73.0 in | Wt 241.0 lb

## 2023-11-17 DIAGNOSIS — I10 Essential (primary) hypertension: Secondary | ICD-10-CM

## 2023-11-17 DIAGNOSIS — N1831 Chronic kidney disease, stage 3a: Secondary | ICD-10-CM | POA: Diagnosis not present

## 2023-11-17 DIAGNOSIS — E118 Type 2 diabetes mellitus with unspecified complications: Secondary | ICD-10-CM | POA: Diagnosis not present

## 2023-11-17 DIAGNOSIS — E782 Mixed hyperlipidemia: Secondary | ICD-10-CM | POA: Diagnosis not present

## 2023-11-17 DIAGNOSIS — M5416 Radiculopathy, lumbar region: Secondary | ICD-10-CM

## 2023-11-17 LAB — GLUCOSE, POCT (MANUAL RESULT ENTRY): POC Glucose: 121 mg/dL — AB (ref 70–99)

## 2023-11-17 NOTE — Addendum Note (Signed)
 Addended by: BRYCE MARKER AHMAD on: 11/17/2023 09:08 AM   Modules accepted: Level of Service

## 2023-11-17 NOTE — Progress Notes (Signed)
 Established Patient Office Visit  Subjective:  Patient ID: Ian Gibson, male    DOB: 09-18-1945  Age: 78 y.o. MRN: [id number]  Chief Complaint  Patient presents with   Follow-up    3 month    No new complaints, here for lab review and medication refills. Labs reviewed and notable for well controlled diabetes, A1c remains at target, lipids higher but at target with cmp notable for stable CKD3. Denies any hypoglycemic episodes and home bg readings have been at target. Deferred  surgical option for lumbar stenosis.     No other concerns at this time.   Past Medical History:  Diagnosis Date   CKD (chronic kidney disease)    Hepatic cirrhosis due to chronic hepatitis C infection (HCC) 2015   Treated with Harvoni .  cured   Hypertension    Seizure (HCC)    on Keppra .  No seizures in many years.    Past Surgical History:  Procedure Laterality Date   CATARACT EXTRACTION W/PHACO Right 09/16/2021   Procedure:  7.44 00:54.4;  Surgeon: Jaye Fallow, MD;  Location: [facility];  Service: Ophthalmology;  Laterality: Right;   COLONOSCOPY     COLONOSCOPY WITH PROPOFOL  N/A 03/18/2023   Procedure: COLONOSCOPY WITH PROPOFOL ;  Surgeon: Jinny Carmine, MD;  [facility];  Service: Endoscopy;  Laterality: N/A;   ESOPHAGOGASTRODUODENOSCOPY     POLYPECTOMY  03/18/2023   Procedure: POLYPECTOMY;  Surgeon: Jinny Carmine, MD;  Location: [facility] ENDOSCOPY;  Service: Endoscopy;;    Social History   Socioeconomic History   Marital status: Married    Spouse name: Not on file   Number of children: Not on file   Years of education: Not on file   Highest education level: Not on file  Occupational History   Not on file  Tobacco Use   Smoking status: Never   Smokeless tobacco: Never  Vaping Use   Vaping status: Never Used  Substance and Sexual Activity   Alcohol use: Never   Drug use: Never   Sexual activity: Not on file  Other Topics Concern   Not on file  Social  History Narrative   Not on file   Social Drivers of Health   Financial Resource Strain: Not on file  Food Insecurity: Not on file  Transportation Needs: Not on file  Physical Activity: Not on file  Stress: Not on file  Social Connections: Not on file  Intimate Partner Violence: Not on file    No family history on file.  No Known Allergies  Outpatient Medications Prior to Visit  Medication Sig   aspirin EC 81 MG tablet Take by mouth.   atorvastatin  (LIPITOR) 40 MG tablet Take 1 tablet (40 mg total) by mouth daily.   cetirizine  (ZYRTEC ) 10 MG tablet Take 1 tablet (10 mg total) by mouth every morning.   clobetasol cream (TEMOVATE) 0.05 % APP THIN FILM AA ON BACK BID UNTIL CLEAR   gabapentin  (NEURONTIN ) 800 MG tablet Take 1 tablet (800 mg total) by mouth 3 (three) times daily.   hydrochlorothiazide  (MICROZIDE ) 12.5 MG capsule Take 1 capsule (12.5 mg total) by mouth daily.   levETIRAcetam  (KEPPRA ) 500 MG tablet Take 1 tablet (500 mg total) by mouth 2 (two) times daily.   nadolol  (CORGARD ) 40 MG tablet Take 1 tablet (40 mg total) by mouth daily.   olmesartan  (BENICAR ) 40 MG tablet TAKE 1 TABLET(40 MG) BY MOUTH DAILY   tamsulosin  (FLOMAX ) 0.4 MG CAPS capsule Take 1 capsule (0.4  mg total) by mouth daily after supper.   zolpidem  (AMBIEN ) 10 MG tablet TAKE 1 TABLET(10 MG) BY MOUTH AT BEDTIME   No facility-administered medications prior to visit.    Review of Systems  Constitutional:  Positive for weight loss (1 lb).  HENT: Negative.    Eyes: Negative.   Respiratory: Negative.    Cardiovascular: Negative.   Gastrointestinal: Negative.   Genitourinary: Negative.  Negative for frequency and urgency.  Musculoskeletal:  Positive for back pain.  Skin: Negative.   Neurological: Negative.  Negative for seizures.  Endo/Heme/Allergies: Negative.   Psychiatric/Behavioral: Negative.         Objective:   BP 122/80   Pulse 71   Temp (!) 95.5 F (35.3 C)   Ht 6' 1 (1.854 m)   Wt  241 lb (109.3 kg)   SpO2 97%   BMI 31.80 kg/m   Vitals:   11/17/23 0822  BP: 122/80  Pulse: 71  Temp: (!) 95.5 F (35.3 C)  Height: 6' 1 (1.854 m)  Weight: 241 lb (109.3 kg)  SpO2: 97%  BMI (Calculated): 31.8    Physical Exam Vitals reviewed.  Constitutional:      Appearance: Normal appearance.  HENT:     Head: Normocephalic.     Left Ear: There is no impacted cerumen.     Nose: Nose normal.     Mouth/Throat:     Mouth: Mucous membranes are moist.     Pharynx: No posterior oropharyngeal erythema.  Eyes:     Extraocular Movements: Extraocular movements intact.     Pupils: Pupils are equal, round, and reactive to light.  Cardiovascular:     Rate and Rhythm: Regular rhythm.     Chest Wall: PMI is not displaced.     Pulses: Normal pulses.     Heart sounds: Normal heart sounds. No murmur heard. Pulmonary:     Effort: Pulmonary effort is normal.     Breath sounds: Normal air entry. No rhonchi or rales.  Abdominal:     General: Abdomen is flat. Bowel sounds are normal. There is no distension.     Palpations: Abdomen is soft. There is no hepatomegaly, splenomegaly or mass.     Tenderness: There is no abdominal tenderness.  Musculoskeletal:        General: Normal range of motion.     Cervical back: Normal range of motion and neck supple.     Right lower leg: Edema present.     Left lower leg: Edema present.  Skin:    General: Skin is warm and dry.  Neurological:     General: No focal deficit present.     Mental Status: He is alert and oriented to person, place, and time.     Cranial Nerves: No cranial nerve deficit.     Motor: No weakness.     Coordination: Coordination abnormal.     Gait: Gait abnormal (tightrope, heel and tip toe. Wide based).  Psychiatric:        Mood and Affect: Mood normal.        Behavior: Behavior normal.      Results for orders placed or performed in visit on 11/17/23  POCT Glucose (CBG)  Result Value Ref Range   POC Glucose 121 (A)  70 - 99 mg/dl    Recent Results (from the past 2160 hours)  POCT CBG (Fasting - Glucose)     Status: Abnormal   Collection Time: 09/22/23  8:34 AM  Result Value Ref Range  Glucose Fasting, POC 120 (A) 70 - 99 mg/dL  Aldolase     Status: None   Collection Time: 09/22/23  9:20 AM  Result Value Ref Range   Aldolase 4.9 3.3 - 10.3 U/L  Hepatic function panel     Status: None   Collection Time: 09/22/23  9:20 AM  Result Value Ref Range   Total Protein 6.8 6.0 - 8.5 g/dL   Albumin 4.5 3.8 - 4.8 g/dL   Bilirubin Total 0.5 0.0 - 1.2 mg/dL   Bilirubin, Direct 9.79 0.00 - 0.40 mg/dL   Alkaline Phosphatase 68 44 - 121 IU/L   AST 23 0 - 40 IU/L   ALT 17 0 - 44 IU/L  Sed Rate (ESR)     Status: None   Collection Time: 09/22/23  9:20 AM  Result Value Ref Range   Sed Rate 2 0 - 30 mm/hr  POCT Glucose (CBG)     Status: Abnormal   Collection Time: 10/06/23  9:04 AM  Result Value Ref Range   POC Glucose 106 (A) 70 - 99 mg/dl  PSA     Status: None   Collection Time: 10/13/23  8:11 AM  Result Value Ref Range   Prostatic Specific Antigen 0.21 0.00 - 4.00 ng/mL    Comment: (NOTE) While PSA levels of <=4.00 ng/ml are reported as reference range, some men with levels below 4.00 ng/ml can have prostate cancer and many men with PSA above 4.00 ng/ml do not have prostate cancer.  Other tests such as free PSA, age specific reference ranges, PSA velocity and PSA doubling time may be helpful especially in men less than 36 years old. Performed at [facility], [address], [place], [place] [address]   Comprehensive metabolic panel with GFR     Status: Abnormal   Collection Time: 11/08/23  9:15 AM  Result Value Ref Range   Glucose 101 (H) 70 - 99 mg/dL   BUN 12 8 - 27 mg/dL   Creatinine, Ser 8.47 (H) 0.76 - 1.27 mg/dL   eGFR 47 (L) >40 fO/fpw/8.26   BUN/Creatinine Ratio 8 (L) 10 - 24   Sodium 142 134 - 144 mmol/L   Potassium 4.8 3.5 - 5.2 mmol/L   Chloride 101 96 - 106 mmol/L   CO2 24  20 - 29 mmol/L   Calcium  9.6 8.6 - 10.2 mg/dL   Total Protein 6.6 6.0 - 8.5 g/dL   Albumin 4.5 3.8 - 4.8 g/dL   Globulin, Total 2.1 1.5 - 4.5 g/dL   Bilirubin Total 0.5 0.0 - 1.2 mg/dL   Alkaline Phosphatase 76 44 - 121 IU/L   AST 30 0 - 40 IU/L   ALT 24 0 - 44 IU/L  Lipid panel     Status: None   Collection Time: 11/08/23  9:15 AM  Result Value Ref Range   Cholesterol, Total 176 100 - 199 mg/dL   Triglycerides 856 0 - 149 mg/dL   HDL 52 >60 mg/dL   VLDL Cholesterol Cal 25 5 - 40 mg/dL   LDL Chol Calc (NIH) 99 0 - 99 mg/dL   Chol/HDL Ratio 3.4 0.0 - 5.0 ratio    Comment:                                   T. Chol/HDL Ratio  Men  Women                               1/2 Avg.Risk  3.4    3.3                                   Avg.Risk  5.0    4.4                                2X Avg.Risk  9.6    7.1                                3X Avg.Risk 23.4   11.0   Hemoglobin A1c     Status: Abnormal   Collection Time: 11/08/23  9:15 AM  Result Value Ref Range   Hgb A1c MFr Bld 5.9 (H) 4.8 - 5.6 %    Comment:          Prediabetes: 5.7 - 6.4          Diabetes: >6.4          Glycemic control for adults with diabetes: <7.0    Est. average glucose Bld gHb Est-mCnc 123 mg/dL  CK     Status: Abnormal   Collection Time: 11/08/23  9:15 AM  Result Value Ref Range   Total CK 457 (H) 41 - 331 U/L  POCT Glucose (CBG)     Status: Abnormal   Collection Time: 11/17/23  8:29 AM  Result Value Ref Range   POC Glucose 121 (A) 70 - 99 mg/dl      Assessment & Plan:  Ian Gibson was seen today for follow-up.  Diabetes mellitus type 2 with complications (HCC) -     POCT glucose (manual entry) -     Hemoglobin A1c  Mixed hyperlipidemia -     Lipid panel  Stage 3a chronic kidney disease (HCC) Overview:  Orders: -     Comprehensive metabolic panel with GFR  Primary hypertension -     CBC With Diff/Platelet  Lumbar nerve root impingement -continue  conservative management as he declines surgery    Problem List Items Addressed This Visit       Cardiovascular and Mediastinum   Hypertension   Relevant Orders   CBC With Diff/Platelet     Endocrine   Diabetes mellitus type 2 with complications (HCC) - Primary   Relevant Orders   POCT Glucose (CBG) (Completed)     Genitourinary   Stage 3a chronic kidney disease (HCC)     Other   Hyperlipidemia, unspecified   Other Visit Diagnoses       Lumbar nerve root impingement           Return in about 3 months (around 02/17/2024) for awv with labs prior.   Total time spent: 20 minutes  Sherrill Cinderella Perry, MD  11/17/2023   This document may have been prepared by [facility] Voice Recognition software and as such may include unintentional dictation errors.

## 2024-02-02 ENCOUNTER — Other Ambulatory Visit: Payer: Self-pay | Admitting: Internal Medicine

## 2024-02-02 DIAGNOSIS — I10 Essential (primary) hypertension: Secondary | ICD-10-CM

## 2024-02-02 DIAGNOSIS — G40409 Other generalized epilepsy and epileptic syndromes, not intractable, without status epilepticus: Secondary | ICD-10-CM

## 2024-02-03 ENCOUNTER — Other Ambulatory Visit: Payer: Self-pay | Admitting: Internal Medicine

## 2024-02-03 DIAGNOSIS — E782 Mixed hyperlipidemia: Secondary | ICD-10-CM

## 2024-02-16 ENCOUNTER — Other Ambulatory Visit

## 2024-02-16 LAB — CBC WITH DIFF/PLATELET
Basophils Absolute: 0.1 x10E3/uL (ref 0.0–0.2)
Basos: 1 %
EOS (ABSOLUTE): 0.3 x10E3/uL (ref 0.0–0.4)
Eos: 4 %
Hematocrit: 41.2 % (ref 37.5–51.0)
Hemoglobin: 13.5 g/dL (ref 13.0–17.7)
Immature Grans (Abs): 0 x10E3/uL (ref 0.0–0.1)
Immature Granulocytes: 0 %
Lymphocytes Absolute: 1.1 x10E3/uL (ref 0.7–3.1)
Lymphs: 18 %
MCH: 31.9 pg (ref 26.6–33.0)
MCHC: 32.8 g/dL (ref 31.5–35.7)
MCV: 97 fL (ref 79–97)
Monocytes Absolute: 0.8 x10E3/uL (ref 0.1–0.9)
Monocytes: 13 %
Neutrophils Absolute: 4.1 x10E3/uL (ref 1.4–7.0)
Neutrophils: 64 %
Platelets: 207 x10E3/uL (ref 150–450)
RBC: 4.23 x10E6/uL (ref 4.14–5.80)
RDW: 12.8 % (ref 11.6–15.4)
WBC: 6.3 x10E3/uL (ref 3.4–10.8)

## 2024-02-17 LAB — LIPID PANEL
Chol/HDL Ratio: 3.5 ratio (ref 0.0–5.0)
Cholesterol, Total: 188 mg/dL (ref 100–199)
HDL: 53 mg/dL (ref >39)
LDL Chol Calc (NIH): 110 mg/dL — ABNORMAL HIGH (ref 0–99)
Triglycerides: 142 mg/dL (ref 0–149)
VLDL Cholesterol Cal: 25 mg/dL (ref 5–40)

## 2024-02-17 LAB — COMPREHENSIVE METABOLIC PANEL WITH GFR
ALT: 35 IU/L (ref 0–44)
AST: 38 IU/L (ref 0–40)
Albumin: 4.4 g/dL (ref 3.8–4.8)
Alkaline Phosphatase: 82 IU/L (ref 47–123)
BUN/Creatinine Ratio: 8 — ABNORMAL LOW (ref 10–24)
BUN: 11 mg/dL (ref 8–27)
Bilirubin Total: 0.6 mg/dL (ref 0.0–1.2)
CO2: 27 mmol/L (ref 20–29)
Calcium: 9.8 mg/dL (ref 8.6–10.2)
Chloride: 103 mmol/L (ref 96–106)
Creatinine, Ser: 1.34 mg/dL — ABNORMAL HIGH (ref 0.76–1.27)
Globulin, Total: 2.3 g/dL (ref 1.5–4.5)
Glucose: 106 mg/dL — ABNORMAL HIGH (ref 70–99)
Potassium: 4.4 mmol/L (ref 3.5–5.2)
Sodium: 144 mmol/L (ref 134–144)
Total Protein: 6.7 g/dL (ref 6.0–8.5)
eGFR: 55 mL/min/1.73 — ABNORMAL LOW (ref >59)

## 2024-02-17 LAB — HEMOGLOBIN A1C
Est. average glucose Bld gHb Est-mCnc: 123 mg/dL
Hgb A1c MFr Bld: 5.9 % — ABNORMAL HIGH (ref 4.8–5.6)

## 2024-02-23 ENCOUNTER — Ambulatory Visit: Admitting: Internal Medicine

## 2024-02-23 ENCOUNTER — Encounter: Payer: Self-pay | Admitting: Internal Medicine

## 2024-02-23 ENCOUNTER — Telehealth: Payer: Self-pay

## 2024-02-23 ENCOUNTER — Ambulatory Visit: Payer: Self-pay | Admitting: Internal Medicine

## 2024-02-23 VITALS — BP 138/86 | HR 74 | Temp 96.8°F | Ht 73.0 in | Wt 249.4 lb

## 2024-02-23 DIAGNOSIS — E118 Type 2 diabetes mellitus with unspecified complications: Secondary | ICD-10-CM

## 2024-02-23 DIAGNOSIS — Z1389 Encounter for screening for other disorder: Secondary | ICD-10-CM

## 2024-02-23 DIAGNOSIS — Z0001 Encounter for general adult medical examination with abnormal findings: Secondary | ICD-10-CM

## 2024-02-23 DIAGNOSIS — J301 Allergic rhinitis due to pollen: Secondary | ICD-10-CM | POA: Diagnosis not present

## 2024-02-23 DIAGNOSIS — Z739 Problem related to life management difficulty, unspecified: Secondary | ICD-10-CM | POA: Diagnosis not present

## 2024-02-23 DIAGNOSIS — E1342 Other specified diabetes mellitus with diabetic polyneuropathy: Secondary | ICD-10-CM | POA: Diagnosis not present

## 2024-02-23 DIAGNOSIS — Z1331 Encounter for screening for depression: Secondary | ICD-10-CM

## 2024-02-23 DIAGNOSIS — B182 Chronic viral hepatitis C: Secondary | ICD-10-CM

## 2024-02-23 DIAGNOSIS — I10 Essential (primary) hypertension: Secondary | ICD-10-CM

## 2024-02-23 DIAGNOSIS — G40409 Other generalized epilepsy and epileptic syndromes, not intractable, without status epilepticus: Secondary | ICD-10-CM | POA: Diagnosis not present

## 2024-02-23 LAB — POCT CBG (FASTING - GLUCOSE)-MANUAL ENTRY: Glucose Fasting, POC: 118 mg/dL — AB (ref 70–99)

## 2024-02-23 MED ORDER — GABAPENTIN 800 MG PO TABS: 800.0000 mg | ORAL_TABLET | Freq: Three times a day (TID) | ORAL | 1 refills | Status: AC

## 2024-02-23 MED ORDER — HYDROCHLOROTHIAZIDE 12.5 MG PO CAPS: 12.5000 mg | ORAL_CAPSULE | Freq: Every day | ORAL | 1 refills | Status: AC

## 2024-02-23 MED ORDER — LEVETIRACETAM 500 MG PO TABS: 500.0000 mg | ORAL_TABLET | Freq: Two times a day (BID) | ORAL | 1 refills | Status: AC

## 2024-02-23 MED ORDER — CETIRIZINE HCL 10 MG PO TABS: 10.0000 mg | ORAL_TABLET | Freq: Every morning | ORAL | 1 refills | Status: AC

## 2024-02-23 NOTE — Progress Notes (Signed)
 Established Patient Office Visit  Subjective:  Patient ID: Ian Gibson, male    DOB: Apr 16, 1945  Age: 78 y.o. MRN: [id number]  Chief Complaint  Patient presents with   Follow-up    3 month follow up    No new complaints, here for lab review and medication refills. Labs reviewed and notable for well controlled diabetes, A1c at target, LDL deteriorated with  cmp notable for improvement in GFR although still CKD 3a. Denies any hypoglycemic episodes and home bg readings have been at target.     No other concerns at this time.   Past Medical History:  Diagnosis Date   CKD (chronic kidney disease)    Hepatic cirrhosis due to chronic hepatitis C infection (HCC) 2015   Treated with Harvoni .  cured   Hypertension    Seizure (HCC)    on Keppra .  No seizures in many years.    Past Surgical History:  Procedure Laterality Date   CATARACT EXTRACTION W/PHACO Right 09/16/2021   Procedure:  7.44 00:54.4;  Surgeon: Jaye Fallow, MD;  Location: [facility];  Service: Ophthalmology;  Laterality: Right;   COLONOSCOPY     COLONOSCOPY WITH PROPOFOL  N/A 03/18/2023   Procedure: COLONOSCOPY WITH PROPOFOL ;  Surgeon: Jinny Carmine, MD;  Location: [facility];  Service: Endoscopy;  Laterality: N/A;   ESOPHAGOGASTRODUODENOSCOPY     POLYPECTOMY  03/18/2023   Procedure: POLYPECTOMY;  Surgeon: Jinny Carmine, MD;  Location: [facility];  Service: Endoscopy;;    Social History   Socioeconomic History   Marital status: Married    Spouse name: Not on file   Number of children: Not on file   Years of education: Not on file   Highest education level: Not on file  Occupational History   Not on file  Tobacco Use   Smoking status: Never   Smokeless tobacco: Never  Vaping Use   Vaping status: Never Used  Substance and Sexual Activity   Alcohol use: Never   Drug use: Never   Sexual activity: Not on file  Other Topics Concern   Not on file  Social History Narrative   Not on  file   Social Drivers of Health   Financial Resource Strain: Not on file  Food Insecurity: Not on file  Transportation Needs: Not on file  Physical Activity: Not on file  Stress: Not on file  Social Connections: Not on file  Intimate Partner Violence: Not on file    No family history on file.  No Known Allergies  Outpatient Medications Prior to Visit  Medication Sig   aspirin EC 81 MG tablet Take by mouth.   atorvastatin  (LIPITOR) 40 MG tablet TAKE 1 TABLET(40 MG) BY MOUTH EVERY EVENING   clobetasol cream (TEMOVATE) 0.05 % APP THIN FILM AA ON BACK BID UNTIL CLEAR   nadolol  (CORGARD ) 40 MG tablet Take 1 tablet (40 mg total) by mouth daily.   olmesartan  (BENICAR ) 40 MG tablet TAKE 1 TABLET(40 MG) BY MOUTH DAILY   tamsulosin  (FLOMAX ) 0.4 MG CAPS capsule Take 1 capsule (0.4 mg total) by mouth daily after supper.   zolpidem  (AMBIEN ) 10 MG tablet TAKE 1 TABLET(10 MG) BY MOUTH AT BEDTIME   [DISCONTINUED] cetirizine  (ZYRTEC ) 10 MG tablet Take 1 tablet (10 mg total) by mouth every morning.   [DISCONTINUED] gabapentin  (NEURONTIN ) 800 MG tablet Take 1 tablet (800 mg total) by mouth 3 (three) times daily.   [DISCONTINUED] hydrochlorothiazide  (MICROZIDE ) 12.5 MG capsule Take 1 capsule (12.5 mg total) by  mouth daily.   [DISCONTINUED] levETIRAcetam  (KEPPRA ) 500 MG tablet Take 1 tablet (500 mg total) by mouth 2 (two) times daily.   No facility-administered medications prior to visit.    Review of Systems  Constitutional:  Negative for weight loss (8 lbs).  HENT: Negative.    Eyes: Negative.   Respiratory: Negative.    Cardiovascular: Negative.   Gastrointestinal: Negative.   Genitourinary: Negative.  Negative for frequency and urgency.  Musculoskeletal:  Positive for back pain.  Skin: Negative.   Neurological: Negative.  Negative for seizures.  Endo/Heme/Allergies: Negative.   Psychiatric/Behavioral: Negative.         Objective:   BP 138/86   Pulse 74   Temp (!) 96.8 F (36 C)  (Tympanic)   Ht 6' 1 (1.854 m)   Wt 249 lb 6.4 oz (113.1 kg)   SpO2 97%   BMI 32.90 kg/m   Vitals:   02/23/24 0824  BP: 138/86  Pulse: 74  Temp: (!) 96.8 F (36 C)  Height: 6' 1 (1.854 m)  Weight: 249 lb 6.4 oz (113.1 kg)  SpO2: 97%  TempSrc: Tympanic  BMI (Calculated): 32.91    Physical Exam Vitals reviewed.  Constitutional:      Appearance: Normal appearance.  HENT:     Head: Normocephalic.     Left Ear: There is no impacted cerumen.     Nose: Nose normal.     Mouth/Throat:     Mouth: Mucous membranes are moist.     Pharynx: No posterior oropharyngeal erythema.  Eyes:     Extraocular Movements: Extraocular movements intact.     Pupils: Pupils are equal, round, and reactive to light.  Cardiovascular:     Rate and Rhythm: Regular rhythm.     Chest Wall: PMI is not displaced.     Pulses: Normal pulses.     Heart sounds: Normal heart sounds. No murmur heard. Pulmonary:     Effort: Pulmonary effort is normal.     Breath sounds: Normal air entry. No rhonchi or rales.  Abdominal:     General: Abdomen is flat. Bowel sounds are normal. There is no distension.     Palpations: Abdomen is soft. There is no hepatomegaly, splenomegaly or mass.     Tenderness: There is no abdominal tenderness.  Musculoskeletal:        General: Normal range of motion.     Cervical back: Normal range of motion and neck supple.     Right lower leg: Edema present.     Left lower leg: Edema present.  Skin:    General: Skin is warm and dry.  Neurological:     General: No focal deficit present.     Mental Status: He is alert and oriented to person, place, and time.     Cranial Nerves: No cranial nerve deficit.     Motor: No weakness.     Coordination: Coordination abnormal.     Gait: Gait abnormal (tightrope, heel and tip toe. Wide based).  Psychiatric:        Mood and Affect: Mood normal.        Behavior: Behavior normal.      Results for orders placed or performed in visit on  02/23/24  POCT CBG (Fasting - Glucose)  Result Value Ref Range   Glucose Fasting, POC 118 (A) 70 - 99 mg/dL    Recent Results (from the past 2160 hours)  Lipid panel     Status: Abnormal   Collection Time: 02/16/24  8:40 AM  Result Value Ref Range   Cholesterol, Total 188 100 - 199 mg/dL   Triglycerides 857 0 - 149 mg/dL   HDL 53 >60 mg/dL   VLDL Cholesterol Cal 25 5 - 40 mg/dL   LDL Chol Calc (NIH) 889 (H) 0 - 99 mg/dL   Chol/HDL Ratio 3.5 0.0 - 5.0 ratio    Comment:                                   T. Chol/HDL Ratio                                             Men  Women                               1/2 Avg.Risk  3.4    3.3                                   Avg.Risk  5.0    4.4                                2X Avg.Risk  9.6    7.1                                3X Avg.Risk 23.4   11.0   Comprehensive metabolic panel with GFR     Status: Abnormal   Collection Time: 02/16/24  8:40 AM  Result Value Ref Range   Glucose 106 (H) 70 - 99 mg/dL   BUN 11 8 - 27 mg/dL   Creatinine, Ser 8.65 (H) 0.76 - 1.27 mg/dL   eGFR 55 (L) >40 fO/fpw/8.26   BUN/Creatinine Ratio 8 (L) 10 - 24   Sodium 144 134 - 144 mmol/L   Potassium 4.4 3.5 - 5.2 mmol/L   Chloride 103 96 - 106 mmol/L   CO2 27 20 - 29 mmol/L   Calcium  9.8 8.6 - 10.2 mg/dL   Total Protein 6.7 6.0 - 8.5 g/dL   Albumin 4.4 3.8 - 4.8 g/dL   Globulin, Total 2.3 1.5 - 4.5 g/dL   Bilirubin Total 0.6 0.0 - 1.2 mg/dL   Alkaline Phosphatase 82 47 - 123 IU/L   AST 38 0 - 40 IU/L   ALT 35 0 - 44 IU/L  Hemoglobin A1c     Status: Abnormal   Collection Time: 02/16/24  8:41 AM  Result Value Ref Range   Hgb A1c MFr Bld 5.9 (H) 4.8 - 5.6 %    Comment:          Prediabetes: 5.7 - 6.4          Diabetes: >6.4          Glycemic control for adults with diabetes: <7.0    Est. average glucose Bld gHb Est-mCnc 123 mg/dL  CBC With Diff/Platelet     Status: None   Collection Time: 02/16/24  8:41 AM  Result Value Ref Range   WBC 6.3 3.4 - 10.8  x10E3/uL   RBC 4.23 4.14 -  5.80 x10E6/uL   Hemoglobin 13.5 13.0 - 17.7 g/dL   Hematocrit 58.7 62.4 - 51.0 %   MCV 97 79 - 97 fL   MCH 31.9 26.6 - 33.0 pg   MCHC 32.8 31.5 - 35.7 g/dL   RDW 87.1 88.3 - 84.5 %   Platelets 207 150 - 450 x10E3/uL   Neutrophils 64 Not Estab. %   Lymphs 18 Not Estab. %   Monocytes 13 Not Estab. %   Eos 4 Not Estab. %   Basos 1 Not Estab. %   Neutrophils Absolute 4.1 1.4 - 7.0 x10E3/uL   Lymphocytes Absolute 1.1 0.7 - 3.1 x10E3/uL   Monocytes Absolute 0.8 0.1 - 0.9 x10E3/uL   EOS (ABSOLUTE) 0.3 0.0 - 0.4 x10E3/uL   Basophils Absolute 0.1 0.0 - 0.2 x10E3/uL   Immature Granulocytes 0 Not Estab. %   Immature Grans (Abs) 0.0 0.0 - 0.1 x10E3/uL  POCT CBG (Fasting - Glucose)     Status: Abnormal   Collection Time: 02/23/24  8:33 AM  Result Value Ref Range   Glucose Fasting, POC 118 (A) 70 - 99 mg/dL      Assessment & Plan:  Siddhartha was seen today for follow-up.  Diabetes mellitus type 2 with complications (HCC) -     POCT CBG (Fasting - Glucose)  Allergic rhinitis due to pollen, unspecified seasonality -     Cetirizine  HCl; Take 1 tablet (10 mg total) by mouth every morning.  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  Dispense: 270 tablet; Refill: 1  Primary hypertension -     hydroCHLOROthiazide ; Take 1 capsule (12.5 mg total) by mouth daily.  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   Total time spent: 30 minutes. This time includes review of previous notes and results and patient face to face interaction during today'Ataya Murdy visit.    Sherrill Cinderella Perry, MD  02/23/2024   This document may have been prepared by [facility] Voice Recognition software and as such may include unintentional dictation errors.

## 2024-02-23 NOTE — Telephone Encounter (Signed)
 Wife called requesting to schedule repeat US ... Per your notes below, does patient no longer need regular monitoring as it says no sign of cirrhosis?  The ultrasound showed some gallstones in the gallbladder without any inflammation or gallbladder problems.  Stones are very common in the gallbladder and of no concern at this time.  The liver showed some fatty liver. There is nothing further he needs to do except try to lose weight to take some of the fat out of the liver.   The tumor marker was negative for any worrisome features such as liver cancer.  The blood test showed that he has some fibrosis (a condition where the liver develops scar tissue as a result of chronic inflammation), but no sign of cirrhosis (scar tissue due to chronic liver damage).

## 2024-02-28 MED ORDER — NADOLOL 40 MG PO TABS: 40.0000 mg | ORAL_TABLET | Freq: Every day | ORAL | 2 refills | Status: AC

## 2024-02-28 NOTE — Addendum Note (Signed)
 Addended by: LANNIE ANDREA GRADE on: 02/28/2024 09:48 AM   Modules accepted: Orders

## 2024-02-28 NOTE — Telephone Encounter (Signed)
 Spouse stated that they have both had a lot of health issues including cancer and she would like to hold off through the holidays for anything medical related unless absolutely necessary... delayed message sent to myself to give them a call the end of Jan 2026

## 2024-04-13 ENCOUNTER — Inpatient Hospital Stay: Attending: Radiation Oncology

## 2024-04-13 DIAGNOSIS — C61 Malignant neoplasm of prostate: Secondary | ICD-10-CM

## 2024-04-13 LAB — PSA: Prostatic Specific Antigen: 0.07 ng/mL (ref 0.00–4.00)

## 2024-04-20 ENCOUNTER — Encounter: Payer: Self-pay | Admitting: Radiation Oncology

## 2024-04-20 ENCOUNTER — Ambulatory Visit
Admission: RE | Admit: 2024-04-20 | Discharge: 2024-04-20 | Disposition: A | Discharge status: Home / Self Care | Source: Ambulatory Visit | Attending: Radiation Oncology | Admitting: Radiation Oncology

## 2024-04-20 VITALS — BP 146/76 | HR 62 | Temp 96.8°F | Resp 15 | Ht 73.0 in | Wt 256.7 lb

## 2024-04-20 DIAGNOSIS — Z923 Personal history of irradiation: Secondary | ICD-10-CM | POA: Insufficient documentation

## 2024-04-20 DIAGNOSIS — C61 Malignant neoplasm of prostate: Secondary | ICD-10-CM | POA: Insufficient documentation

## 2024-04-20 NOTE — Progress Notes (Signed)
 Radiation Oncology Follow up Note  Name: Ian Gibson   Date:   04/20/2024 MRN:  [id number] DOB: 08-22-1945    This 78 y.o. male presents to the clinic today for 26-month follow-up status post image guided IMRT radiation therapy for Gleason 7 (3+4) adenocarcinoma the prostate presented with a PSA in the 16 range.  REFERRING PROVIDER: Albina GORMAN Dine, MD  HPI: Patient is a 79 year old male now out close to 2 years having completed image guided IMRT radiation therapy for Gleason 7 adenocarcinoma the prostate seen today in routine follow-up he is doing well.  He specifically denies any increased lower urinary tract symptoms diarrhea or fatigue.  His most recent PSA is 0.07.SABRA  COMPLICATIONS OF TREATMENT: none  FOLLOW UP COMPLIANCE: keeps appointments   PHYSICAL EXAM:  BP (!) 146/76   Pulse 62   Temp (!) 96.8 F (36 C) (Tympanic)   Resp 15   Ht 6' 1 (1.854 m)   Wt 256 lb 11.2 oz (116.4 kg)   BMI 33.87 kg/m  Well-developed well-nourished patient in NAD. HEENT reveals PERLA, EOMI, discs not visualized.  Oral cavity is clear. No oral mucosal lesions are identified. Neck is clear without evidence of cervical or supraclavicular adenopathy. Lungs are clear to A&P. Cardiac examination is essentially unremarkable with regular rate and rhythm without murmur rub or thrill. Abdomen is benign with no organomegaly or masses noted. Motor sensory and DTR levels are equal and symmetric in the upper and lower extremities. Cranial nerves II through XII are grossly intact. Proprioception is intact. No peripheral adenopathy or edema is identified. No motor or sensory levels are noted. Crude visual fields are within normal range.  RADIOLOGY RESULTS: No current films for review  PLAN: At the present time patient is under excellent biochemical control of his prostate cancer.  I am going out 1 year and follow-up with repeat PSA.  Patient knows to call with any concerns at any time.  I would like to take  this opportunity to thank you for allowing me to participate in the care of your patient.SABRA Marcey Penton, MD

## 2024-04-28 ENCOUNTER — Other Ambulatory Visit: Payer: Self-pay | Admitting: Internal Medicine

## 2024-04-28 DIAGNOSIS — G40409 Other generalized epilepsy and epileptic syndromes, not intractable, without status epilepticus: Secondary | ICD-10-CM

## 2024-04-28 DIAGNOSIS — E1342 Other specified diabetes mellitus with diabetic polyneuropathy: Secondary | ICD-10-CM

## 2024-04-28 DIAGNOSIS — I10 Essential (primary) hypertension: Secondary | ICD-10-CM

## 2024-04-28 DIAGNOSIS — E782 Mixed hyperlipidemia: Secondary | ICD-10-CM

## 2024-04-28 MED ORDER — ZOLPIDEM TARTRATE 10 MG PO TABS: 10.0000 mg | ORAL_TABLET | Freq: Every evening | ORAL | 0 refills | Status: AC | PRN

## 2024-04-28 MED ORDER — OLMESARTAN MEDOXOMIL 40 MG PO TABS: 40.0000 mg | ORAL_TABLET | Freq: Every day | ORAL | 1 refills | Status: AC

## 2024-05-31 ENCOUNTER — Ambulatory Visit: Admitting: Internal Medicine

## 2025-04-19 ENCOUNTER — Inpatient Hospital Stay

## 2025-04-26 ENCOUNTER — Ambulatory Visit: Admitting: Radiation Oncology
